# Patient Record
Sex: Female | Born: 1935 | Race: White | Hispanic: No | Smoking: Never smoker
Health system: Southern US, Community
[De-identification: ages and names within clinical notes are randomized; demographics above are authoritative.]

## PROBLEM LIST (undated history)

## (undated) DIAGNOSIS — F0150 Vascular dementia without behavioral disturbance: Secondary | ICD-10-CM

## (undated) DIAGNOSIS — I10 Essential (primary) hypertension: Secondary | ICD-10-CM

## (undated) DIAGNOSIS — M1990 Unspecified osteoarthritis, unspecified site: Secondary | ICD-10-CM

## (undated) DIAGNOSIS — E7800 Pure hypercholesterolemia, unspecified: Secondary | ICD-10-CM

## (undated) DIAGNOSIS — R319 Hematuria, unspecified: Secondary | ICD-10-CM

## (undated) DIAGNOSIS — N289 Disorder of kidney and ureter, unspecified: Secondary | ICD-10-CM

## (undated) DIAGNOSIS — B029 Zoster without complications: Secondary | ICD-10-CM

## (undated) DIAGNOSIS — B0229 Other postherpetic nervous system involvement: Secondary | ICD-10-CM

## (undated) DIAGNOSIS — G459 Transient cerebral ischemic attack, unspecified: Secondary | ICD-10-CM

## (undated) HISTORY — PX: APPENDECTOMY: SHX54

## (undated) HISTORY — PX: TUBAL LIGATION: SHX77

## (undated) HISTORY — PX: COLPORRHAPHY: SHX921

---

## 1999-02-02 ENCOUNTER — Encounter: Payer: Self-pay | Admitting: Family Medicine

## 2000-06-19 ENCOUNTER — Other Ambulatory Visit: Admission: RE | Admit: 2000-06-19 | Discharge: 2000-06-19 | Payer: Self-pay | Admitting: Family Medicine

## 2001-12-14 ENCOUNTER — Ambulatory Visit (HOSPITAL_COMMUNITY): Admission: RE | Admit: 2001-12-14 | Discharge: 2001-12-14 | Payer: Self-pay | Admitting: Specialist

## 2002-01-13 ENCOUNTER — Ambulatory Visit (HOSPITAL_COMMUNITY): Admission: RE | Admit: 2002-01-13 | Discharge: 2002-01-13 | Payer: Self-pay | Admitting: Specialist

## 2003-05-26 ENCOUNTER — Ambulatory Visit (HOSPITAL_COMMUNITY): Admission: RE | Admit: 2003-05-26 | Discharge: 2003-05-26 | Payer: Self-pay | Admitting: Family Medicine

## 2003-05-26 ENCOUNTER — Encounter: Payer: Self-pay | Admitting: Family Medicine

## 2007-03-19 ENCOUNTER — Ambulatory Visit: Payer: Self-pay | Admitting: Family Medicine

## 2008-01-11 ENCOUNTER — Emergency Department: Payer: Self-pay | Admitting: Emergency Medicine

## 2008-09-23 ENCOUNTER — Ambulatory Visit: Payer: Self-pay | Admitting: Urology

## 2011-06-05 ENCOUNTER — Ambulatory Visit: Payer: Self-pay | Admitting: Family Medicine

## 2012-11-06 ENCOUNTER — Ambulatory Visit: Payer: Self-pay | Admitting: Neurology

## 2013-11-25 ENCOUNTER — Ambulatory Visit: Payer: Self-pay | Admitting: Urology

## 2014-08-21 ENCOUNTER — Emergency Department (HOSPITAL_COMMUNITY): Payer: Medicare Other

## 2014-08-21 ENCOUNTER — Encounter (HOSPITAL_COMMUNITY): Payer: Self-pay | Admitting: Emergency Medicine

## 2014-08-21 ENCOUNTER — Inpatient Hospital Stay (HOSPITAL_COMMUNITY)
Admission: EM | Admit: 2014-08-21 | Discharge: 2014-08-24 | DRG: 065 | Disposition: A | Discharge status: Skilled Nursing Facility | Payer: Medicare Other | Attending: Infectious Diseases | Admitting: Infectious Diseases

## 2014-08-21 ENCOUNTER — Inpatient Hospital Stay (HOSPITAL_COMMUNITY): Payer: Medicare Other

## 2014-08-21 DIAGNOSIS — I10 Essential (primary) hypertension: Secondary | ICD-10-CM | POA: Diagnosis present

## 2014-08-21 DIAGNOSIS — I2699 Other pulmonary embolism without acute cor pulmonale: Secondary | ICD-10-CM

## 2014-08-21 DIAGNOSIS — I129 Hypertensive chronic kidney disease with stage 1 through stage 4 chronic kidney disease, or unspecified chronic kidney disease: Secondary | ICD-10-CM | POA: Diagnosis present

## 2014-08-21 DIAGNOSIS — Z8673 Personal history of transient ischemic attack (TIA), and cerebral infarction without residual deficits: Secondary | ICD-10-CM | POA: Diagnosis not present

## 2014-08-21 DIAGNOSIS — I6340 Cerebral infarction due to embolism of unspecified cerebral artery: Secondary | ICD-10-CM | POA: Diagnosis present

## 2014-08-21 DIAGNOSIS — R29810 Facial weakness: Secondary | ICD-10-CM

## 2014-08-21 DIAGNOSIS — Z7982 Long term (current) use of aspirin: Secondary | ICD-10-CM | POA: Diagnosis not present

## 2014-08-21 DIAGNOSIS — I639 Cerebral infarction, unspecified: Secondary | ICD-10-CM

## 2014-08-21 DIAGNOSIS — N189 Chronic kidney disease, unspecified: Secondary | ICD-10-CM | POA: Diagnosis present

## 2014-08-21 DIAGNOSIS — R4781 Slurred speech: Secondary | ICD-10-CM

## 2014-08-21 DIAGNOSIS — I480 Paroxysmal atrial fibrillation: Secondary | ICD-10-CM | POA: Diagnosis present

## 2014-08-21 DIAGNOSIS — I82409 Acute embolism and thrombosis of unspecified deep veins of unspecified lower extremity: Secondary | ICD-10-CM

## 2014-08-21 DIAGNOSIS — Z79899 Other long term (current) drug therapy: Secondary | ICD-10-CM | POA: Diagnosis not present

## 2014-08-21 DIAGNOSIS — B0229 Other postherpetic nervous system involvement: Secondary | ICD-10-CM | POA: Diagnosis present

## 2014-08-21 DIAGNOSIS — E785 Hyperlipidemia, unspecified: Secondary | ICD-10-CM | POA: Diagnosis present

## 2014-08-21 DIAGNOSIS — I63412 Cerebral infarction due to embolism of left middle cerebral artery: Secondary | ICD-10-CM

## 2014-08-21 HISTORY — DX: Unspecified osteoarthritis, unspecified site: M19.90

## 2014-08-21 HISTORY — DX: Transient cerebral ischemic attack, unspecified: G45.9

## 2014-08-21 HISTORY — DX: Disorder of kidney and ureter, unspecified: N28.9

## 2014-08-21 HISTORY — DX: Pure hypercholesterolemia, unspecified: E78.00

## 2014-08-21 HISTORY — DX: Hematuria, unspecified: R31.9

## 2014-08-21 HISTORY — DX: Zoster without complications: B02.9

## 2014-08-21 HISTORY — DX: Other postherpetic nervous system involvement: B02.29

## 2014-08-21 HISTORY — DX: Essential (primary) hypertension: I10

## 2014-08-21 LAB — URINALYSIS, ROUTINE W REFLEX MICROSCOPIC
Bilirubin Urine: NEGATIVE
Glucose, UA: NEGATIVE mg/dL
Ketones, ur: NEGATIVE mg/dL
Nitrite: NEGATIVE
Protein, ur: NEGATIVE mg/dL
Specific Gravity, Urine: 1.008 (ref 1.005–1.030)
Urobilinogen, UA: 0.2 mg/dL (ref 0.0–1.0)
pH: 6.5 (ref 5.0–8.0)

## 2014-08-21 LAB — DIFFERENTIAL
Basophils Absolute: 0 10*3/uL (ref 0.0–0.1)
Basophils Relative: 0 % (ref 0–1)
Eosinophils Absolute: 0.1 10*3/uL (ref 0.0–0.7)
Eosinophils Relative: 1 % (ref 0–5)
Lymphocytes Relative: 22 % (ref 12–46)
Lymphs Abs: 2.5 10*3/uL (ref 0.7–4.0)
Monocytes Absolute: 0.8 10*3/uL (ref 0.1–1.0)
Monocytes Relative: 7 % (ref 3–12)
Neutro Abs: 8 10*3/uL — ABNORMAL HIGH (ref 1.7–7.7)
Neutrophils Relative %: 70 % (ref 43–77)

## 2014-08-21 LAB — URINE MICROSCOPIC-ADD ON

## 2014-08-21 LAB — ETHANOL

## 2014-08-21 LAB — COMPREHENSIVE METABOLIC PANEL WITH GFR
ALT: 13 U/L (ref 0–35)
AST: 20 U/L (ref 0–37)
Albumin: 3.8 g/dL (ref 3.5–5.2)
Alkaline Phosphatase: 112 U/L (ref 39–117)
Anion gap: 14 (ref 5–15)
BUN: 41 mg/dL — ABNORMAL HIGH (ref 6–23)
CO2: 25 meq/L (ref 19–32)
Calcium: 9 mg/dL (ref 8.4–10.5)
Chloride: 99 meq/L (ref 96–112)
Creatinine, Ser: 2.4 mg/dL — ABNORMAL HIGH (ref 0.50–1.10)
GFR calc Af Amer: 20 mL/min — ABNORMAL LOW
GFR calc non Af Amer: 17 mL/min — ABNORMAL LOW
Glucose, Bld: 107 mg/dL — ABNORMAL HIGH (ref 70–99)
Potassium: 4.8 meq/L (ref 3.7–5.3)
Sodium: 138 meq/L (ref 137–147)
Total Bilirubin: 0.4 mg/dL (ref 0.3–1.2)
Total Protein: 6.8 g/dL (ref 6.0–8.3)

## 2014-08-21 LAB — I-STAT CHEM 8, ED
BUN: 37 mg/dL — ABNORMAL HIGH (ref 6–23)
Calcium, Ion: 1.08 mmol/L — ABNORMAL LOW (ref 1.13–1.30)
Chloride: 104 meq/L (ref 96–112)
Creatinine, Ser: 2.5 mg/dL — ABNORMAL HIGH (ref 0.50–1.10)
Glucose, Bld: 107 mg/dL — ABNORMAL HIGH (ref 70–99)
HCT: 42 % (ref 36.0–46.0)
Hemoglobin: 14.3 g/dL (ref 12.0–15.0)
Potassium: 4.6 meq/L (ref 3.7–5.3)
Sodium: 137 meq/L (ref 137–147)
TCO2: 23 mmol/L (ref 0–100)

## 2014-08-21 LAB — I-STAT TROPONIN, ED: TROPONIN I, POC: 0.03 ng/mL (ref 0.00–0.08)

## 2014-08-21 LAB — PROTIME-INR
INR: 1.08 (ref 0.00–1.49)
Prothrombin Time: 14.1 s (ref 11.6–15.2)

## 2014-08-21 LAB — APTT: aPTT: 27 seconds (ref 24–37)

## 2014-08-21 LAB — RAPID URINE DRUG SCREEN, HOSP PERFORMED
Amphetamines: NOT DETECTED
Barbiturates: NOT DETECTED
Benzodiazepines: NOT DETECTED
Cocaine: NOT DETECTED
Opiates: NOT DETECTED
Tetrahydrocannabinol: NOT DETECTED

## 2014-08-21 LAB — CBC
HCT: 39.6 % (ref 36.0–46.0)
Hemoglobin: 13.3 g/dL (ref 12.0–15.0)
MCH: 30.2 pg (ref 26.0–34.0)
MCHC: 33.6 g/dL (ref 30.0–36.0)
MCV: 90 fL (ref 78.0–100.0)
Platelets: 342 10*3/uL (ref 150–400)
RBC: 4.4 MIL/uL (ref 3.87–5.11)
RDW: 13.8 % (ref 11.5–15.5)
WBC: 11.3 10*3/uL — ABNORMAL HIGH (ref 4.0–10.5)

## 2014-08-21 MED ORDER — SENNOSIDES-DOCUSATE SODIUM 8.6-50 MG PO TABS: 1.0000 | ORAL_TABLET | Freq: Every evening | ORAL | Status: DC | PRN

## 2014-08-21 MED ORDER — GABAPENTIN 400 MG PO CAPS
400.0000 mg | ORAL_CAPSULE | ORAL | Status: DC
  Administered 2014-08-21 – 2014-08-24 (×8): 400 mg via ORAL
  Filled 2014-08-21 (×8): qty 1

## 2014-08-21 MED ORDER — LIDOCAINE 5 % EX PTCH
1.0000 | MEDICATED_PATCH | CUTANEOUS | Status: DC
  Administered 2014-08-21: 1 via TRANSDERMAL
  Filled 2014-08-21: qty 1

## 2014-08-21 MED ORDER — SIMVASTATIN 20 MG PO TABS: 20.0000 mg | ORAL_TABLET | Freq: Every day | ORAL | Status: DC

## 2014-08-21 MED ORDER — SODIUM CHLORIDE 0.9 % IV SOLN
INTRAVENOUS | Status: AC
  Administered 2014-08-21 – 2014-08-22 (×2): via INTRAVENOUS

## 2014-08-21 MED ORDER — ASPIRIN EC 81 MG PO TBEC: 81.0000 mg | DELAYED_RELEASE_TABLET | Freq: Every day | ORAL | Status: DC

## 2014-08-21 MED ORDER — ASPIRIN 325 MG PO TABS
325.0000 mg | ORAL_TABLET | Freq: Every day | ORAL | Status: DC
  Administered 2014-08-21 – 2014-08-22 (×2): 325 mg via ORAL
  Filled 2014-08-21 (×2): qty 1

## 2014-08-21 MED ORDER — SIMVASTATIN 20 MG PO TABS
20.0000 mg | ORAL_TABLET | Freq: Every day | ORAL | Status: DC
  Administered 2014-08-21 – 2014-08-23 (×3): 20 mg via ORAL
  Filled 2014-08-21 (×3): qty 1

## 2014-08-21 MED ORDER — STROKE: EARLY STAGES OF RECOVERY BOOK
Freq: Once | Status: AC
  Administered 2014-08-21: 1
  Filled 2014-08-21: qty 1

## 2014-08-21 MED ORDER — HEPARIN SODIUM (PORCINE) 5000 UNIT/ML IJ SOLN
5000.0000 [IU] | Freq: Three times a day (TID) | INTRAMUSCULAR | Status: DC
  Administered 2014-08-21 – 2014-08-23 (×5): 5000 [IU] via SUBCUTANEOUS
  Filled 2014-08-21 (×5): qty 1

## 2014-08-21 MED ORDER — TRAMADOL HCL 50 MG PO TABS
50.0000 mg | ORAL_TABLET | Freq: Three times a day (TID) | ORAL | Status: DC
  Administered 2014-08-21 – 2014-08-24 (×8): 50 mg via ORAL
  Filled 2014-08-21 (×8): qty 1

## 2014-08-21 NOTE — Progress Notes (Signed)
Received from ER via stretcher; oriented to room and unit routine; daughter at bedside; patient alert and cooperative; assisted off the stretcher; was incontinent of urine; assisted to bedside commode then assisted into bed; vital signs taken. No acute distress.

## 2014-08-21 NOTE — H&P (Signed)
Date: 08/21/2014               Patient Name:  Kristina Aguirre MRN: [id number]  DOB: [birth year] Age / Sex: 80 y.o., female   PCP: Jerl Mina, MD         Medical Service: Internal Medicine Teaching Service         Attending Physician: Dr. Ginnie Smart, MD    First Contact: Dr. Senaida Ores Pager: [phone]  Second Contact: Dr. Yetta Barre Pager: [phone]       After Hours (After 5p/  First Contact Pager: [phone]  weekends / holidays): Second Contact Pager: [phone]   Chief Complaint: facial droop, numbness  History of Present Illness: Kristina Aguirre is a 80 yo female with PMHx of HTN, HLD, TIA, CKD, shingles and post-herpetic neuralgia who presented to the ED with complaint of facial droop and numbness. Patient awoke this morning around 7 am and went to eat her breakfast as normal around 0730. She noticed that she didn't feel right but couldn't describe her symptoms. Patient did admit to some dysarthria when she called her daughter, but it has since improved. She also had numbness of her right hand in all 5 digits which has improved to only involving her lateral 3 fingers. Patient admits to weakness, but this is chronic for her and not worsened from baseline. Patient denies any headache, dizziness, chest pain, shortness of breath, or nausea. She has chronic pain on the right side of her face and neck as a result from shingles two years ago. She lives by herself at an assisted living facility and generally has few issues. She walks each day and can complete her ADLs independently. She is unsure of her family history as she is adopted. Her son passed away from an MI. She tried smoking tobacco in college but was not a smoker. Patient has been taking her medications as prescribed.  CT Head showed no acute intracranial abnormality, cerebral and cerebellar atrophy, mild chronic microvascular ischemic white matter disease, and intracranial atherosclerosis.  MRI Head showed acute/subacute focal infarcts  involving the left frontal operculum and subcortical white matter and moderate generalized atrophy and diffuse white matter disease likely reflecting the sequelae of chronic microvascular ischemia.  Meds: Prescriptions prior to admission  Medication Sig Dispense Refill Last Dose  . amLODipine (NORVASC) 2.5 MG tablet Take 2.5 mg by mouth daily.   08/21/2014 at Unknown time  . aspirin EC 81 MG tablet Take 81 mg by mouth at bedtime.   08/20/2014 at Unknown time  . gabapentin (NEURONTIN) 400 MG capsule Take 400 mg by mouth 3 (three) times daily. Morning, 3pm and bedtime   08/21/2014 at Unknown time  . lidocaine (LIDODERM) 5 % Place 1 patch onto the skin daily. Remove & Discard patch within 12 hours or as directed by MD   08/20/2014 at Unknown time  . loratadine (CLARITIN) 10 MG tablet Take 10 mg by mouth daily.   08/21/2014 at Unknown time  . OVER THE COUNTER MEDICATION Place 1 drop into both eyes at bedtime. Over the counter eye drops for dry eyes   08/20/2014 at Unknown time  . simvastatin (ZOCOR) 20 MG tablet Take 20 mg by mouth at bedtime.    08/20/2014 at Unknown time  . traMADol (ULTRAM) 50 MG tablet Take 50-100 mg by mouth 3 (three) times daily. Take 1 tablet (50 mg) every morning and at bedtime, take 2 tablets (100 mg) daily at 3pm   08/21/2014 at Unknown time  Current Facility-Administered Medications  Medication Dose Route Frequency Provider Last Rate Last Dose  .  stroke: mapping our early stages of recovery book   Does not apply Once Courtney Paris, MD      . 0.9 %  sodium chloride infusion   Intravenous Continuous Courtney Paris, MD      . Melene Muller ON 08/22/2014] aspirin EC tablet 81 mg  81 mg Oral QHS Courtney Paris, MD      . aspirin tablet 325 mg  325 mg Oral Daily Courtney Paris, MD   325 mg at 08/21/14 1654  . gabapentin (NEURONTIN) capsule 400 mg  400 mg Oral 3 times per day Courtney Paris, MD      . heparin injection 5,000 Units  5,000 Units Subcutaneous 3 times per day Courtney Paris, MD      .  lidocaine (LIDODERM) 5 % 1 patch  1 patch Transdermal Q24H Courtney Paris, MD      . senna-docusate (Senokot-S) tablet 1 tablet  1 tablet Oral QHS PRN Courtney Paris, MD      . simvastatin (ZOCOR) tablet 20 mg  20 mg Oral QHS Courtney Paris, MD      . traMADol Janean Sark) tablet 50 mg  50 mg Oral TID Courtney Paris, MD        Allergies: Allergies as of 08/21/2014 - Review Complete 08/21/2014  Allergen Reaction Noted  . Penicillins Hives 08/21/2014   Past Medical History  Diagnosis Date  . Hypertension   . High cholesterol   . TIA (transient ischemic attack)   . Renal disorder   . Hematuria   . Arthritis   . Post herpetic neuralgia     Right side of neck  . Shingles    Past Surgical History  Procedure Laterality Date  . Tubal ligation     History reviewed. No pertinent family history. History   Social History  . Marital Status: Widowed    Spouse Name: N/A    Number of Children: N/A  . Years of Education: N/A   Occupational History  . Not on file.   Social History Main Topics  . Smoking status: Never Smoker   . Smokeless tobacco: Never Used  . Alcohol Use: 0.6 oz/week    1 Glasses of wine per week     Comment: "I like a glass of wine occasionally"  . Drug Use: No  . Sexual Activity: Not on file   Other Topics Concern  . Not on file   Social History Narrative  . No narrative on file    Review of Systems: General: Denies fever, chills, fatigue, change in appetite and diaphoresis.  Respiratory: Denies SOB, cough, DOE, chest tightness, and wheezing.   Cardiovascular: Denies chest pain and palpitations.  Gastrointestinal: Denies nausea, vomiting, abdominal pain, diarrhea, constipation, blood in stool and abdominal distention.  Genitourinary: Denies dysuria, urgency, frequency, hematuria, suprapubic pain and flank pain. Endocrine: Denies hot or cold intolerance, polyuria, and polydipsia. Musculoskeletal: Denies myalgias, back pain, joint swelling, arthralgias and gait  problem.  Skin: Denies pallor, rash and wounds.  Neurological: Admits to numbness in right hand, dysarthria and right facial droop. Denies dizziness, headaches, weakness, lightheadedness, seizures, and syncope. Psychiatric/Behavioral: Denies mood changes, confusion, nervousness, sleep disturbance and agitation.  Physical Exam: Filed Vitals:   08/21/14 1530 08/21/14 1535 08/21/14 1545 08/21/14 1629  BP: 151/65  161/62 145/64  Pulse: 71  73 66  Temp:  98.8 F (37.1 C)  98.1 F (36.7 C)  TempSrc:    Oral  Resp: 20  22 20   SpO2: 97%  97% 97%   General: Vital signs reviewed.  Patient is well-developed and well-nourished, in no acute distress and cooperative with exam.  Eyes: PERRLA, EOMI, conjunctivae normal, no scleral icterus.  Neck: Chronic flexion. No carotid bruit present.  Cardiovascular: RRR. 3/6 systolic ejection murmur, S1 normal, S2 normal, no gallops, or rubs. Pulmonary/Chest: Clear to auscultation bilaterally, no wheezes, rales, or rhonchi. Abdominal: Soft, non-tender, non-distended, BS +, no masses, organomegaly, or guarding present.  Musculoskeletal: No joint deformities, erythema, or stiffness, ROM full and nontender. Extremities: No lower extremity edema bilaterally,  pulses symmetric and intact bilaterally. No cyanosis or clubbing. Skin: Warm, dry and intact. No rashes or erythema. Psychiatric: Normal mood and affect. speech and behavior is normal. Cognition and memory are normal.   Neurologic Exam:   Mental Status: Alert, oriented x 3, thought content appropriate. Speech fluent without evidence of aphasia. Able to follow 3 step commands without difficulty.  Cranial Nerves:   II: Discs flat bilaterally. Visual fields grossly intact.  III/IV/VI: Extraocular movements intact.  Pupils reactive bilaterally.  V/VII: Smile asymmetric, facial droop on right. facial light touch sensation normal bilaterally.  VIII: Grossly intact.  XI: Bilateral shoulder shrug normal.  XII:  Midline tongue extension normal.  Motor:  4/5 bilaterally with normal tone and bulk  Sensory:  Pinprick and light touch decreased in medial 3 fingers (3, 4, 5) in right hand.  Cerebellar: Normal finger-to-nose, slow rapid alternating movements and normal heel-to-shin test.     Lab results: Basic Metabolic Panel:  Recent Labs  16/07/9610/08/15 1305 08/21/14 1313  NA 138 137  K 4.8 4.6  CL 99 104  CO2 25  --   GLUCOSE 107* 107*  BUN 41* 37*  CREATININE 2.40* 2.50*  CALCIUM 9.0  --    Liver Function Tests:  Recent Labs  08/21/14 1305  AST 20  ALT 13  ALKPHOS 112  BILITOT 0.4  PROT 6.8  ALBUMIN 3.8   CBC:  Recent Labs  08/21/14 1305 08/21/14 1313  WBC 11.3*  --   NEUTROABS 8.0*  --   HGB 13.3 14.3  HCT 39.6 42.0  MCV 90.0  --   PLT 342  --    Coagulation:  Recent Labs  08/21/14 1305  LABPROT 14.1  INR 1.08   Urine Drug Screen: Drugs of Abuse     Component Value Date/Time   LABOPIA NONE DETECTED 08/21/2014 1354   COCAINSCRNUR NONE DETECTED 08/21/2014 1354   LABBENZ NONE DETECTED 08/21/2014 1354   AMPHETMU NONE DETECTED 08/21/2014 1354   THCU NONE DETECTED 08/21/2014 1354   LABBARB NONE DETECTED 08/21/2014 1354    Alcohol Level:  Recent Labs  08/21/14 1305  ETH <11   Urinalysis:  Recent Labs  08/21/14 1354  COLORURINE YELLOW  LABSPEC 1.008  PHURINE 6.5  GLUCOSEU NEGATIVE  HGBUR MODERATE*  BILIRUBINUR NEGATIVE  KETONESUR NEGATIVE  PROTEINUR NEGATIVE  UROBILINOGEN 0.2  NITRITE NEGATIVE  LEUKOCYTESUR TRACE*   Imaging results:  Ct Head Wo Contrast  08/21/2014   CLINICAL DATA:  80 year old female with right-sided facial droop and slurred speech  EXAM: CT HEAD WITHOUT CONTRAST  TECHNIQUE: Contiguous axial images were obtained from the base of the skull through the vertex without intravenous contrast.  COMPARISON:  None.  FINDINGS: Negative for acute intracranial hemorrhage, acute infarction, mass, mass effect, hydrocephalus or midline shift.  Gray-white differentiation is preserved throughout. Visualized  globes are symmetric and unremarkable bilaterally. No focal soft tissue or calvarial abnormality. Mild global cerebral and cerebellar atrophy. Very mild periventricular white matter hypoattenuation consistent was sequelae of longstanding microvascular ischemic white matter disease. Atherosclerotic calcifications present in both cavernous carotid arteries. Normal aeration of the mastoid air cells and visualized paranasal sinuses.  IMPRESSION: 1. No acute intracranial abnormality 2. Cerebral and cerebellar atrophy 3. Mild chronic microvascular ischemic white matter disease. 4. Intracranial atherosclerosis   Electronically Signed   By: Malachy Moan M.D.   On: 08/21/2014 13:24   Mr Brain Wo Contrast  08/21/2014   CLINICAL DATA:  Patient awoke with focal right hand numbness. She is head slurred speech during the day. Right-sided facial droop.  EXAM: MRI HEAD WITHOUT CONTRAST  TECHNIQUE: Multiplanar, multiecho pulse sequences of the brain and surrounding structures were obtained without intravenous contrast.  COMPARISON:  CT head without contrast from the same day.  FINDINGS: A punctate area of cortical restricted diffusion is present in the left frontal operculum. There is cortical and subjacent white matter restricted diffusion more medially in the left frontal operculum as well, evident on image 23 of series 5 and image 18 of series 8. Minimal T2 changes are associated with these areas.  Moderate generalized atrophy and diffuse white matter changes are present bilaterally. Flow is present in the major intracranial arteries. Patient is status post bilateral lens replacements. The paranasal sinuses and mastoid air cells are clear.  IMPRESSION: 1. Acute/subacute focal infarcts involving the left frontal operculum and subcortical white matter as described. 2. Moderate generalized atrophy and diffuse white matter disease likely reflects the sequelae of  chronic microvascular ischemia.   Electronically Signed   By: Gennette Pac M.D.   On: 08/21/2014 15:23   Assessment & Plan by Problem: Active Problems:   CVA (cerebral vascular accident)   Essential hypertension   Acute embolic stroke  Acute Left MCA CVA: Patient presented with complaint of right facial droop and right hand numbness that was presented when she awoke this morning at 0700. Last known well was yesterday. Patient is out of the tPA window. MRI Head showed acute/subacute focal infarcts involving the left frontal operculum and subcortical white matter and moderate generalized atrophy and diffuse white matter disease likely reflecting the sequelae of chronic microvascular ischemia. NIH score 3. Patient was on ASA 81 mg daily. Patient was seen by neurology who recommended stroke work up and risk factor modification. -Hemoglobin A1c -Fasting lipid panel -MRA Brain w/o contrast -Echo -Carotid Dopplers  -Aspirin 325 mg daily -Telemetry monitoring -Aspiration precautions -Bed rest -neuro checks Q2H -PT/OT -SCDs  HTN: Blood pressure ranging from 140s/60s to 160s/60s. Patient is normally on amlodipine 2.5 mg daily at home. We will allow for permissive hypertension.  -Allow for permissive hypertension <220   HLD: Patient has history of hyperlipidemia, but there are no records of a lipid panel in our system. Patient is on simvastatin 20 mg daily at home.  -Lipid panel -Simvastatin 20 mg daily   Post-herpetic Neuralgia: Patient has post-herpetic neuralgia on the right side of her face, right ear and right lateral neck as a result of a shingles outbreak 2 years ago. -Tramadol 50 mg QAM and QHS -Tramadol 100 mg at lunchtime  DVT/PE ppx: Heparin SQ TID  Dispo: Disposition is deferred at this time, awaiting improvement of current medical problems. Anticipated discharge in approximately 1-2 day(s).   The patient does have a current PCP Jerl Mina, MD) and does not need an [facility]  hospital follow-up appointment after discharge.  The patient does not have transportation limitations that hinder transportation to clinic appointments.  Signed: Jill AlexandersAlexa Richardson, DO PGY-1 Internal Medicine Resident Pager # [phone] 08/21/2014 5:14 PM

## 2014-08-21 NOTE — Plan of Care (Signed)
Problem: Consults Goal: Ischemic Stroke Patient Education See Patient Education Module for education specifics.  Outcome: Completed/Met Date Met:  08/21/14

## 2014-08-21 NOTE — ED Provider Notes (Addendum)
CSN: [id number]     Arrival date & time 08/21/14  1303 History   First MD Initiated Contact with Patient 08/21/14 1305     Chief Complaint  Patient presents with  . Cerebrovascular Accident     (Consider location/radiation/quality/duration/timing/severity/associated sxs/prior Treatment) The history is provided by the patient and the EMS personnel. The history is limited by the condition of the patient.  pt arrives from her independent living situation by ems.  Pt indicates last seemed/felt normal yesterday. She awoke around 7 am today, and states she just didn't feel right, ?right hand numbness.  Pt is very poor/difficult historian - level 5 caveat.   She states she was able to eat breakfast, and sometime later was talking to daughter on phone and said she expressed that she wasn't feeling right, and daughter ?noted that her speech seemed different from baseline, and ems was callled.  Ems noted ?right facial droop, and states pt is moving bil extremities and has equal grips.  Unclear from pt/report when patient last seen normal ?sometime yesterday prior to going to bed.      No past medical history on file. No past surgical history on file. No family history on file. History  Substance Use Topics  . Smoking status: Not on file  . Smokeless tobacco: Not on file  . Alcohol Use: Not on file   OB History    No data available     Review of Systems  Constitutional: Negative for fever and chills.  HENT: Negative for sore throat.   Eyes: Negative for visual disturbance.  Respiratory: Negative for cough and shortness of breath.   Cardiovascular: Negative for chest pain.  Gastrointestinal: Negative for vomiting, abdominal pain and diarrhea.  Genitourinary: Negative for flank pain.  Musculoskeletal: Negative for back pain and neck pain.  Skin: Negative for rash.  Neurological: Positive for numbness. Negative for headaches.  Hematological: Does not bruise/bleed easily.   Psychiatric/Behavioral: Negative for agitation.      Allergies  Review of patient's allergies indicates not on file.  Home Medications   Prior to Admission medications   Not on File   There were no vitals taken for this visit. Physical Exam  Constitutional: She appears well-developed and well-nourished. No distress.  HENT:  Head: Atraumatic.  Mouth/Throat: Oropharynx is clear and moist.  Eyes: Conjunctivae and EOM are normal. Pupils are equal, round, and reactive to light. No scleral icterus.  Neck: Neck supple. No tracheal deviation present.  No bruit  Cardiovascular: Normal rate, regular rhythm, normal heart sounds and intact distal pulses.   Pulmonary/Chest: Effort normal and breath sounds normal. No respiratory distress.  Abdominal: Soft. Normal appearance and bowel sounds are normal. She exhibits no distension. There is no tenderness.  Genitourinary:  No cva tenderness  Musculoskeletal: She exhibits no edema or tenderness.  Neurological: She is alert.  Awake and alert, speech very quiet, and mildly dysarthric. +right facial droop. No pronator drift. Equal grip. Moves bil ext with good strength.    Skin: Skin is warm and dry. No rash noted. She is not diaphoretic.  Psychiatric: She has a normal mood and affect.  Nursing note and vitals reviewed.   ED Course  Procedures (including critical care time) Labs Review   Results for orders placed or performed during the hospital encounter of 08/21/14  Ethanol  Result Value Ref Range   Alcohol, Ethyl (B) <11 0 - 11 mg/dL  Protime-INR  Result Value Ref Range   Prothrombin Time 14.1 11.6 -  15.2 seconds   INR 1.08 0.00 - 1.49  APTT  Result Value Ref Range   aPTT 27 24 - 37 seconds  CBC  Result Value Ref Range   WBC 11.3 (H) 4.0 - 10.5 K/uL   RBC 4.40 3.87 - 5.11 MIL/uL   Hemoglobin 13.3 12.0 - 15.0 g/dL   HCT 16.139.6 09.636.0 - 04.546.0 %   MCV 90.0 78.0 - 100.0 fL   MCH 30.2 26.0 - 34.0 pg   MCHC 33.6 30.0 - 36.0 g/dL    RDW 40.913.8 81.111.5 - 91.415.5 %   Platelets 342 150 - 400 K/uL  Differential  Result Value Ref Range   Neutrophils Relative % 70 43 - 77 %   Neutro Abs 8.0 (H) 1.7 - 7.7 K/uL   Lymphocytes Relative 22 12 - 46 %   Lymphs Abs 2.5 0.7 - 4.0 K/uL   Monocytes Relative 7 3 - 12 %   Monocytes Absolute 0.8 0.1 - 1.0 K/uL   Eosinophils Relative 1 0 - 5 %   Eosinophils Absolute 0.1 0.0 - 0.7 K/uL   Basophils Relative 0 0 - 1 %   Basophils Absolute 0.0 0.0 - 0.1 K/uL  Comprehensive metabolic panel  Result Value Ref Range   Sodium 138 137 - 147 mEq/L   Potassium 4.8 3.7 - 5.3 mEq/L   Chloride 99 96 - 112 mEq/L   CO2 25 19 - 32 mEq/L   Glucose, Bld 107 (H) 70 - 99 mg/dL   BUN 41 (H) 6 - 23 mg/dL   Creatinine, Ser 7.822.40 (H) 0.50 - 1.10 mg/dL   Calcium 9.0 8.4 - 95.610.5 mg/dL   Total Protein 6.8 6.0 - 8.3 g/dL   Albumin 3.8 3.5 - 5.2 g/dL   AST 20 0 - 37 U/L   ALT 13 0 - 35 U/L   Alkaline Phosphatase 112 39 - 117 U/L   Total Bilirubin 0.4 0.3 - 1.2 mg/dL   GFR calc non Af Amer 17 (L) >90 mL/min   GFR calc Af Amer 20 (L) >90 mL/min   Anion gap 14 5 - 15  Urine Drug Screen  Result Value Ref Range   Opiates NONE DETECTED NONE DETECTED   Cocaine NONE DETECTED NONE DETECTED   Benzodiazepines NONE DETECTED NONE DETECTED   Amphetamines NONE DETECTED NONE DETECTED   Tetrahydrocannabinol NONE DETECTED NONE DETECTED   Barbiturates NONE DETECTED NONE DETECTED  Urinalysis, Routine w reflex microscopic  Result Value Ref Range   Color, Urine YELLOW YELLOW   APPearance CLEAR CLEAR   Specific Gravity, Urine 1.008 1.005 - 1.030   pH 6.5 5.0 - 8.0   Glucose, UA NEGATIVE NEGATIVE mg/dL   Hgb urine dipstick MODERATE (A) NEGATIVE   Bilirubin Urine NEGATIVE NEGATIVE   Ketones, ur NEGATIVE NEGATIVE mg/dL   Protein, ur NEGATIVE NEGATIVE mg/dL   Urobilinogen, UA 0.2 0.0 - 1.0 mg/dL   Nitrite NEGATIVE NEGATIVE   Leukocytes, UA TRACE (A) NEGATIVE  Urine microscopic-add on  Result Value Ref Range   Squamous  Epithelial / LPF RARE RARE   WBC, UA 3-6 <3 WBC/hpf   RBC / HPF 0-2 <3 RBC/hpf  I-Stat Chem 8, ED  Result Value Ref Range   Sodium 137 137 - 147 mEq/L   Potassium 4.6 3.7 - 5.3 mEq/L   Chloride 104 96 - 112 mEq/L   BUN 37 (H) 6 - 23 mg/dL   Creatinine, Ser 2.132.50 (H) 0.50 - 1.10 mg/dL   Glucose, Bld 086107 (H) 70 -  99 mg/dL   Calcium, Ion 1.61 (L) 1.13 - 1.30 mmol/L   TCO2 23 0 - 100 mmol/L   Hemoglobin 14.3 12.0 - 15.0 g/dL   HCT 09.6 04.5 - 40.9 %  I-Stat Troponin, ED (not at [facility])  Result Value Ref Range   Troponin i, poc 0.03 0.00 - 0.08 ng/mL   Comment 3           Ct Head Wo Contrast  08/21/2014   CLINICAL DATA:  80 year old female with right-sided facial droop and slurred speech  EXAM: CT HEAD WITHOUT CONTRAST  TECHNIQUE: Contiguous axial images were obtained from the base of the skull through the vertex without intravenous contrast.  COMPARISON:  None.  FINDINGS: Negative for acute intracranial hemorrhage, acute infarction, mass, mass effect, hydrocephalus or midline shift. Gray-white differentiation is preserved throughout. Visualized globes are symmetric and unremarkable bilaterally. No focal soft tissue or calvarial abnormality. Mild global cerebral and cerebellar atrophy. Very mild periventricular white matter hypoattenuation consistent was sequelae of longstanding microvascular ischemic white matter disease. Atherosclerotic calcifications present in both cavernous carotid arteries. Normal aeration of the mastoid air cells and visualized paranasal sinuses.  IMPRESSION: 1. No acute intracranial abnormality 2. Cerebral and cerebellar atrophy 3. Mild chronic microvascular ischemic white matter disease. 4. Intracranial atherosclerosis   Electronically Signed   By: Malachy Moan M.D.   On: 08/21/2014 13:24       EKG Interpretation   Date/Time:  Sunday August 21 2014 13:32:02 EST Ventricular Rate:  71 PR Interval:  64 QRS Duration: 133 QT Interval:  469 QTC Calculation: 510 R  Axis:   34 Text Interpretation:  Sinus rhythm Short PR interval Left bundle branch  block No previous tracing Confirmed by Denton Lank  MD, Caryn Bee ([id number]) on  08/21/2014 1:50:42 PM      MDM   Iv ns. Labs. Ct.  Reviewed nursing notes and prior charts for additional history.   CT neg acute.  Will admit and get mri.  Recheck pt, exam unchanged from prior.  Unassigned med service called for admission.  Discussed w teaching service resident, who indicates temp orders to them/Dr Ninetta Lights, will consult neurology.     Suzi Roots, MD 08/21/14 [phone]

## 2014-08-21 NOTE — Plan of Care (Signed)
Problem: Acute Treatment Outcomes Goal: Airway maintained/protected Outcome: Completed/Met Date Met:  08/21/14     

## 2014-08-21 NOTE — Plan of Care (Signed)
Problem: Acute Treatment Outcomes Goal: Neuro exam at baseline or improved Outcome: Completed/Met Date Met:  08/21/14

## 2014-08-21 NOTE — Consult Note (Signed)
Referring Physician: HATCHER, J    Chief Complaint: new-onset right facial droop and numbness of right hand.  HPI: Kristina Aguirre is an 80 y.o. female history of hypertension, hypercholesterolemia, TIA and arthritis, brought to the emergency room for evaluation of new onset right facial droop and right hand numbness. Patient was last known well yesterday. She woke up with current deficits at about 7 AM this morning. She's been taking aspirin 81 mg per day. CT scan of the head showed no acute normality. MRI showed acute focal infarcts involving left frontal operculum none cortical white matter. NIH stroke score was 3.  LSN: 08/20/2014, unclear time tPA Given: No: unknown last seen well; beyond time window for treatment consideration mRankin:  Past Medical History  Diagnosis Date  . Hypertension   . High cholesterol   . TIA (transient ischemic attack)   . Renal disorder   . Hematuria   . Arthritis   . Post herpetic neuralgia     Right side of neck  . Shingles     History reviewed. No pertinent family history.   Medications: I have reviewed the patient's current medications.  ROS: History obtained from patient's son and daughter-in-law and the patient  General ROS: negative for - chills, fatigue, fever, night sweats, weight gain or weight loss Psychological ROS: negative for - behavioral disorder, hallucinations, memory difficulties, mood swings or suicidal ideation Ophthalmic ROS: negative for - blurry vision, double vision, eye pain or loss of vision ENT ROS: negative for - epistaxis, nasal discharge, oral lesions, sore throat, tinnitus or vertigo Allergy and Immunology ROS: negative for - hives or itchy/watery eyes Hematological and Lymphatic ROS: negative for - bleeding problems, bruising or swollen lymph nodes Endocrine ROS: negative for - galactorrhea, hair pattern changes, polydipsia/polyuria or temperature intolerance Respiratory ROS: negative for - cough, hemoptysis,  shortness of breath or wheezing Cardiovascular ROS: negative for - chest pain, dyspnea on exertion, edema or irregular heartbeat Gastrointestinal ROS: negative for - abdominal pain, diarrhea, hematemesis, nausea/vomiting or stool incontinence Genito-Urinary ROS: negative for - dysuria, hematuria, incontinence or urinary frequency/urgency Musculoskeletal ROS: negative for - joint swelling or muscular weakness Neurological ROS: as noted in HPI Dermatological ROS: negative for rash and skin lesion changes  Physical Examination: Blood pressure 161/62, pulse 73, temperature 98.8 F (37.1 C), temperature source Oral, resp. rate 22, SpO2 97 %.  Neurologic Examination: Mental Status: Alert, oriented, thought content appropriate.  Speech moderately slurred without evidence of aphasia. Able to follow commands without difficulty. Cranial Nerves: II-Visual fields were normal. III/IV/VI-Pupils were equal and reacted. Extraocular movements were full and conjugate.    V/VII-no facial numbness and no facial weakness. VIII-normal. X-mild to moderate dysarthria. Motor: 5/5 bilaterally with normal tone and bulk Sensory: Normal throughout. Deep Tendon Reflexes: 2+ and symmetric. Plantars: Flexor bilaterally Cerebellar: Normal finger-to-nose testing.  Ct Head Wo Contrast  08/21/2014   CLINICAL DATA:  80 year old female with right-sided facial droop and slurred speech  EXAM: CT HEAD WITHOUT CONTRAST  TECHNIQUE: Contiguous axial images were obtained from the base of the skull through the vertex without intravenous contrast.  COMPARISON:  None.  FINDINGS: Negative for acute intracranial hemorrhage, acute infarction, mass, mass effect, hydrocephalus or midline shift. Gray-white differentiation is preserved throughout. Visualized globes are symmetric and unremarkable bilaterally. No focal soft tissue or calvarial abnormality. Mild global cerebral and cerebellar atrophy. Very mild periventricular white matter  hypoattenuation consistent was sequelae of longstanding microvascular ischemic white matter disease. Atherosclerotic calcifications present in both cavernous carotid  arteries. Normal aeration of the mastoid air cells and visualized paranasal sinuses.  IMPRESSION: 1. No acute intracranial abnormality 2. Cerebral and cerebellar atrophy 3. Mild chronic microvascular ischemic white matter disease. 4. Intracranial atherosclerosis   Electronically Signed   By: Malachy MoanHeath  McCullough M.D.   On: 08/21/2014 13:24   Mr Brain Wo Contrast  08/21/2014   CLINICAL DATA:  Patient awoke with focal right hand numbness. She is head slurred speech during the day. Right-sided facial droop.  EXAM: MRI HEAD WITHOUT CONTRAST  TECHNIQUE: Multiplanar, multiecho pulse sequences of the brain and surrounding structures were obtained without intravenous contrast.  COMPARISON:  CT head without contrast from the same day.  FINDINGS: A punctate area of cortical restricted diffusion is present in the left frontal operculum. There is cortical and subjacent white matter restricted diffusion more medially in the left frontal operculum as well, evident on image 23 of series 5 and image 18 of series 8. Minimal T2 changes are associated with these areas.  Moderate generalized atrophy and diffuse white matter changes are present bilaterally. Flow is present in the major intracranial arteries. Patient is status post bilateral lens replacements. The paranasal sinuses and mastoid air cells are clear.  IMPRESSION: 1. Acute/subacute focal infarcts involving the left frontal operculum and subcortical white matter as described. 2. Moderate generalized atrophy and diffuse white matter disease likely reflects the sequelae of chronic microvascular ischemia.   Electronically Signed   By: Gennette Pachris  Mattern M.D.   On: 08/21/2014 15:23    Assessment: 80 y.o. female presented with acute left MCA territory ischemic cerebral infarctions.  Stroke Risk Factors -  hyperlipidemia and hypertension  Plan: 1. HgbA1c, fasting lipid panel 2. MRI, MRA  of the brain without contrast 3. PT consult, OT consult, Speech consult 4. Echocardiogram 5. Carotid dopplers 6. Prophylactic therapy-Antiplatelet med: Aspirin  7. Risk factor modification 8. Telemetry monitoring   C.R. Roseanne RenoStewart, MD [facility] [phone]  08/21/2014, 3:59 PM

## 2014-08-21 NOTE — Discharge Summary (Signed)
Name: Kristina Aguirre MRN: [id number] DOB: 09/08/1927 80 y.o. PCP: Jerl MinaJames Hedrick, MD  Date of Admission: 08/21/2014  1:03 PM Date of Discharge: 08/24/2014 Attending Physician: Ginnie SmartJeffrey C Hatcher, MD  Discharge Diagnosis:  Principal Problem:   CVA (cerebral vascular accident) Active Problems:   Essential hypertension   Postherpetic neuralgia   HLD (hyperlipidemia)   Paroxysmal atrial fibrillation  Discharge Medications:   Medication List    STOP taking these medications        aspirin EC 81 MG tablet      TAKE these medications        amLODipine 2.5 MG tablet  Commonly known as:  NORVASC  Take 2.5 mg by mouth daily.     apixaban 2.5 MG Tabs tablet  Commonly known as:  ELIQUIS  Take 1 tablet (2.5 mg total) by mouth 2 (two) times daily.     gabapentin 400 MG capsule  Commonly known as:  NEURONTIN  Take 400 mg by mouth 3 (three) times daily. Morning, 3pm and bedtime     lidocaine 5 %  Commonly known as:  LIDODERM  Place 1 patch onto the skin daily. Remove & Discard patch within 12 hours or as directed by MD     loratadine 10 MG tablet  Commonly known as:  CLARITIN  Take 10 mg by mouth daily.     OVER THE COUNTER MEDICATION  Place 1 drop into both eyes at bedtime. Over the counter eye drops for dry eyes     simvastatin 20 MG tablet  Commonly known as:  ZOCOR  Take 20 mg by mouth at bedtime.     traMADol 50 MG tablet  Commonly known as:  ULTRAM  Take 50-100 mg by mouth 3 (three) times daily. Take 1 tablet (50 mg) every morning and at bedtime, take 2 tablets (100 mg) daily at 3pm        Disposition and follow-up:   [facility]  At the hospital follow up visit please address:  1.  Acute CVA: Please address symptom improvement and medication compliance with Eliquis. Please assess for any signs of bleeding or blood loss.  2.  Labs / imaging needed at time of follow-up: CBC  3.   Pending labs/ test needing follow-up: None  Follow-up Appointments: Follow-up Information    Follow up with HEDRICK,JAMES, MD.   Specialty:  Family Medicine   Why:  to make follow up appointment   Contact information:   [address] [facility] [place] [address] [phone]       Follow up with Xu,Jindong, MD On 09/26/2014.   Specialty:  Neurology   Why:  8:00 am   Contact information:   [address] [place] [phone]       Discharge Instructions: Discharge Instructions    Ambulatory referral to Neurology    Complete by:  As directed   Pt [facility]. Thanks     Call MD for:  difficulty breathing, headache or visual disturbances    Complete by:  As directed      Call MD for:  persistant dizziness or light-headedness    Complete by:  As directed      Diet - low sodium heart healthy    Complete by:  As directed      Diet - low sodium heart healthy  Complete by:  As directed      Increase activity slowly    Complete by:  As directed      Increase activity slowly    Complete by:  As directed            Consultations:    Procedures Performed:  Ct Head Wo Contrast  08/21/2014   CLINICAL DATA:  80 year old female with right-sided facial droop and slurred speech  EXAM: CT HEAD WITHOUT CONTRAST  TECHNIQUE: Contiguous axial images were obtained from the base of the skull through the vertex without intravenous contrast.  COMPARISON:  None.  FINDINGS: Negative for acute intracranial hemorrhage, acute infarction, mass, mass effect, hydrocephalus or midline shift. Gray-white differentiation is preserved throughout. Visualized globes are symmetric and unremarkable bilaterally. No focal soft tissue or calvarial abnormality. Mild global cerebral and cerebellar atrophy. Very mild periventricular white matter hypoattenuation consistent was sequelae of longstanding microvascular ischemic white  matter disease. Atherosclerotic calcifications present in both cavernous carotid arteries. Normal aeration of the mastoid air cells and visualized paranasal sinuses.  IMPRESSION: 1. No acute intracranial abnormality 2. Cerebral and cerebellar atrophy 3. Mild chronic microvascular ischemic white matter disease. 4. Intracranial atherosclerosis   Electronically Signed   By: Malachy MoanHeath  McCullough M.D.   On: 08/21/2014 13:24   Mr Maxine GlennMra Head Wo Contrast  08/21/2014   CLINICAL DATA:  Acute embolic stroke.  EXAM: MRA HEAD WITHOUT CONTRAST  TECHNIQUE: Angiographic images of the Circle of Willis were obtained using MRA technique without intravenous contrast.  COMPARISON:  Brain MRI from earlier the same day  FINDINGS: Patient motion results in a significant decrease in diagnostic sensitivity; aneurysms or stenotic lesions could easily be obscured. 3D acquisition was attempted, but of no added benefit.  Strongly left dominant posterior circulation. The vertebrobasilar system is tortuous but not aneurysmal. The PICAs are grossly patent. Superior cerebellar arteries are visible bilaterally. Symmetric appearance of the posterior cerebral arteries which are patent at least the P3 segments.  Balanced anterior circulation. No evidence for major vessel occlusion (with sensitivity significantly decreased beyond the M1 segments) or high-grade central stenosis.  No evidence of aneurysm.  IMPRESSION: 1. Significantly limited study due to patient motion. 2. No evidence of major vessel occlusion or high-grade stenosis.   Electronically Signed   By: Tiburcio PeaJonathan  Watts M.D.   On: 08/21/2014 18:43   Mr Brain Wo Contrast  08/21/2014   CLINICAL DATA:  Patient awoke with focal right hand numbness. She is head slurred speech during the day. Right-sided facial droop.  EXAM: MRI HEAD WITHOUT CONTRAST  TECHNIQUE: Multiplanar, multiecho pulse sequences of the brain and surrounding structures were obtained without intravenous contrast.  COMPARISON:  CT  head without contrast from the same day.  FINDINGS: A punctate area of cortical restricted diffusion is present in the left frontal operculum. There is cortical and subjacent white matter restricted diffusion more medially in the left frontal operculum as well, evident on image 23 of series 5 and image 18 of series 8. Minimal T2 changes are associated with these areas.  Moderate generalized atrophy and diffuse white matter changes are present bilaterally. Flow is present in the major intracranial arteries. Patient is status post bilateral lens replacements. The paranasal sinuses and mastoid air cells are clear.  IMPRESSION: 1. Acute/subacute focal infarcts involving the left frontal operculum and subcortical white matter as described. 2. Moderate generalized atrophy and diffuse white matter disease likely reflects the sequelae of chronic microvascular ischemia.   Electronically Signed   By:  Gennette Pac M.D.   On: 08/21/2014 15:23    Admission HPI: Ms. Tegtmeyer is a 80 yo female with PMHx of HTN, HLD, TIA, CKD, shingles and post-herpetic neuralgia who presented to the ED with complaint of facial droop and numbness. Patient awoke this morning around 7 am and went to eat her breakfast as normal around 0730. She noticed that she didn't feel right but couldn't describe her symptoms. Patient did admit to some dysarthria when she called her daughter, but it has since improved. She also had numbness of her right hand in all 5 digits which has improved to only involving her lateral 3 fingers. Patient admits to weakness, but this is chronic for her and not worsened from baseline. Patient denies any headache, dizziness, chest pain, shortness of breath, or nausea. She has chronic pain on the right side of her face and neck as a result from shingles two years ago. She lives by herself at an assisted living facility and generally has few issues. She walks each day and can complete her ADLs independently. She is unsure of  her family history as she is adopted. Her son passed away from an MI. She tried smoking tobacco in college but was not a smoker. Patient has been taking her medications as prescribed.  CT Head showed no acute intracranial abnormality, cerebral and cerebellar atrophy, mild chronic microvascular ischemic white matter disease, and intracranial atherosclerosis.  MRI Head showed acute/subacute focal infarcts involving the left frontal operculum and subcortical white matter and moderate generalized atrophy and diffuse white matter disease likely reflecting the sequelae of chronic microvascular ischemia.  Hospital Course by problem list: Principal Problem:   CVA (cerebral vascular accident) Active Problems:   Essential hypertension   Postherpetic neuralgia   HLD (hyperlipidemia)   Paroxysmal atrial fibrillation   Acute Left MCA CVA: Patient presented with complaint of right facial droop and right hand numbness that was presented when she awoke in the morning at 0700. Her last known well was the day prior. Patient was out of the tPA window. MRI Head showed acute/subacute focal infarcts involving the left frontal operculum and subcortical white matter and moderate generalized atrophy and diffuse white matter disease likely reflecting the sequelae of chronic microvascular ischemia. NIH score was 3. Patient was previously on ASA 81 mg daily at home. Patient was seen by neurology who recommended stroke work up and risk factor modification. Labs and tests were unremarkable. Patient does not have diabetes and lipid panel was within normal limits. MRA brain showed no significant major vessel occlusions. Echo showed grade 1 diastolic dysfunction, but no embolic stroke. Carotid dopplers showed no significant stenosis. Patient was on neuro checks, bed rest and telemetry. On the first night of admission, telemetry picked up atrial fibrillation that was previously undiagnosed. This was confirmed with EKG. Patient  spontaneously converted to sinus rhythm. Patient did admit to some dizziness during the episode, but states this is not unusual for her. We discussed anticoagulation with Neurology and with the patient and her family and agreed to starting Eliquis 2.5 mg BID. This dose was confirmed by pharmacy. Patient was educated on the signs and symptoms of bleeding to be aware of. Patient will follow up with her PCP and Neurology.  HTN: Patient's blood pressure ranged from 140s/60s to 160s/60s on admission. She is normally on amlodipine 2.5 mg daily at home. We allowed for permissive hypertension for 48 hours and resumed her amlodipine on discharge.  HLD: Patient has history of hyperlipidemia, but  there were no records of a lipid panel in our system. She is on simvastatin 20 mg daily at home. We continued her home simvastatin 20 mg daily and obtained a lipid panel which was within normal limits. We discussed increasing her statin to a high intensity statin with the patient and her family, but it was decided to leave the dosage at 20 mg.   Post-herpetic Neuralgia: Patient has post-herpetic neuralgia on the right side of her face, right ear and right lateral neck as a result of a shingles outbreak 2 years ago. We continued her home regimen of Tramadol 50 mg QAM and QHS and Tramadol 100 mg at lunchtime, gabapentin 400 mg TID, and lidocaine patches.   Discharge Vitals:   BP 143/64 mmHg  Pulse 63  Temp(Src) 97.5 F (36.4 C) (Oral)  Resp 16  Ht 5\' 2"  (1.575 m)  Wt 55.339 kg (122 lb)  BMI 22.31 kg/m2  SpO2 94%  Signed: Jill Alexanders, DO PGY-1 Internal Medicine Resident Pager # [phone] 08/24/2014 8:48 AM

## 2014-08-21 NOTE — ED Notes (Signed)
Cancelled Code Stroke per Dr. Denton LankSteinl

## 2014-08-21 NOTE — ED Notes (Signed)
Patient transported to MRI 

## 2014-08-21 NOTE — ED Notes (Signed)
[facility] via [facility].  Pt daughter called staff around noon today stating pt had slurred speech during a phone conversation.  Pt reports to EMS she went to bed around 11 pm, feeling normal.  Pt states she "didn't feel right" when she woke up at 7 am.   Pt has right sided facial droop, mild numbness in fingers of right hand.  Equal grips.  Pt in NAD, A&O.

## 2014-08-22 DIAGNOSIS — I369 Nonrheumatic tricuspid valve disorder, unspecified: Secondary | ICD-10-CM

## 2014-08-22 DIAGNOSIS — I6340 Cerebral infarction due to embolism of unspecified cerebral artery: Principal | ICD-10-CM

## 2014-08-22 LAB — BASIC METABOLIC PANEL
ANION GAP: 12 (ref 5–15)
BUN: 30 mg/dL — ABNORMAL HIGH (ref 6–23)
CHLORIDE: 108 meq/L (ref 96–112)
CO2: 24 mEq/L (ref 19–32)
Calcium: 8.5 mg/dL (ref 8.4–10.5)
Creatinine, Ser: 1.55 mg/dL — ABNORMAL HIGH (ref 0.50–1.10)
GFR calc Af Amer: 34 mL/min — ABNORMAL LOW (ref >90)
GFR calc non Af Amer: 29 mL/min — ABNORMAL LOW (ref >90)
Glucose, Bld: 91 mg/dL (ref 70–99)
Potassium: 4.1 mEq/L (ref 3.7–5.3)
Sodium: 144 mEq/L (ref 137–147)

## 2014-08-22 LAB — LIPID PANEL
CHOL/HDL RATIO: 2 ratio
Cholesterol: 147 mg/dL (ref 0–200)
HDL: 74 mg/dL (ref >39)
LDL Cholesterol: 59 mg/dL (ref 0–99)
Triglycerides: 69 mg/dL (ref <150)
VLDL: 14 mg/dL (ref 0–40)

## 2014-08-22 LAB — HEMOGLOBIN A1C
Hgb A1c MFr Bld: 5.9 % — ABNORMAL HIGH (ref <5.7)
MEAN PLASMA GLUCOSE: 123 mg/dL — AB (ref <117)

## 2014-08-22 LAB — CBC
HCT: 37.5 % (ref 36.0–46.0)
Hemoglobin: 12.5 g/dL (ref 12.0–15.0)
MCH: 30 pg (ref 26.0–34.0)
MCHC: 33.3 g/dL (ref 30.0–36.0)
MCV: 90.1 fL (ref 78.0–100.0)
Platelets: 332 10*3/uL (ref 150–400)
RBC: 4.16 MIL/uL (ref 3.87–5.11)
RDW: 14 % (ref 11.5–15.5)
WBC: 7.5 10*3/uL (ref 4.0–10.5)

## 2014-08-22 LAB — PROTIME-INR
INR: 1.13 (ref 0.00–1.49)
Prothrombin Time: 14.6 seconds (ref 11.6–15.2)

## 2014-08-22 MED ORDER — CLOPIDOGREL BISULFATE 75 MG PO TABS
75.0000 mg | ORAL_TABLET | Freq: Every day | ORAL | Status: DC
  Administered 2014-08-23: 75 mg via ORAL
  Filled 2014-08-22: qty 1

## 2014-08-22 MED ORDER — CLOPIDOGREL BISULFATE 75 MG PO TABS: 75.0000 mg | ORAL_TABLET | Freq: Every day | ORAL | Status: DC

## 2014-08-22 MED ORDER — LIDOCAINE 5 % EX PTCH
1.0000 | MEDICATED_PATCH | CUTANEOUS | Status: DC | PRN
  Administered 2014-08-22 – 2014-08-24 (×3): 1 via TRANSDERMAL
  Filled 2014-08-22 (×3): qty 1

## 2014-08-22 NOTE — Progress Notes (Signed)
STROKE TEAM PROGRESS NOTE   HISTORY Kristina Aguirre is an 80 y.o. female history of hypertension, hypercholesterolemia, TIA and arthritis, brought to the emergency room for evaluation of new onset right facial droop and right hand numbness. Patient was last known well yesterday 08/20/2014. She woke up with current deficits at about 7 AM this morning 08/21/2014. She's been taking aspirin 81 mg per day. CT scan of the head showed no acute normality. MRI showed acute focal infarcts involving left frontal operculum none cortical white matter. NIH stroke score was 3.  Patient was not administered TPA secondary to unknown last seen well; beyond time window for treatment consideration. She was admitted for further evaluation and treatment.   SUBJECTIVE (INTERVAL HISTORY) Her daughter is at the bedside.  Overall she feels her condition is gradually improving - she still has language deficits per daughter. Patient is a resident at [facility] in [place], independent living. Her daughter lives in [place]. Her son-in-law is a ED MD at [facility] in [place].   OBJECTIVE Temp:  [97.6 F (36.4 C)-98.1 F (36.7 C)] 98.1 F (36.7 C) (11/09 1401) Pulse Rate:  [62-86] 70 (11/09 1401) Cardiac Rhythm:  [-]  Resp:  [18-20] 18 (11/09 1401) BP: (122-155)/(51-92) 138/60 mmHg (11/09 1401) SpO2:  [94 %-99 %] 98 % (11/09 1401)  No results for input(s): GLUCAP in the last 168 hours.  Recent Labs Lab 08/21/14 1305 08/21/14 1313 08/22/14 0408  NA 138 137 144  K 4.8 4.6 4.1  CL 99 104 108  CO2 25  --  24  GLUCOSE 107* 107* 91  BUN 41* 37* 30*  CREATININE 2.40* 2.50* 1.55*  CALCIUM 9.0  --  8.5    Recent Labs Lab 08/21/14 1305  AST 20  ALT 13  ALKPHOS 112  BILITOT 0.4  PROT 6.8  ALBUMIN 3.8    Recent Labs Lab 08/21/14 1305 08/21/14 1313 08/22/14 0408  WBC 11.3*  --  7.5  NEUTROABS 8.0*  --   --   HGB 13.3 14.3 12.5  HCT 39.6 42.0 37.5  MCV 90.0  --  90.1  PLT 342  --  332   No  results for input(s): CKTOTAL, CKMB, CKMBINDEX, TROPONINI in the last 168 hours.  Recent Labs  08/21/14 1305 08/22/14 0408  LABPROT 14.1 14.6  INR 1.08 1.13    Recent Labs  08/21/14 1354  COLORURINE YELLOW  LABSPEC 1.008  PHURINE 6.5  GLUCOSEU NEGATIVE  HGBUR MODERATE*  BILIRUBINUR NEGATIVE  KETONESUR NEGATIVE  PROTEINUR NEGATIVE  UROBILINOGEN 0.2  NITRITE NEGATIVE  LEUKOCYTESUR TRACE*       Component Value Date/Time   CHOL 147 08/22/2014 0408   TRIG 69 08/22/2014 0408   HDL 74 08/22/2014 0408   CHOLHDL 2.0 08/22/2014 0408   VLDL 14 08/22/2014 0408   LDLCALC 59 08/22/2014 0408   Lab Results  Component Value Date   HGBA1C 5.9* 08/22/2014      Component Value Date/Time   LABOPIA NONE DETECTED 08/21/2014 1354   COCAINSCRNUR NONE DETECTED 08/21/2014 1354   LABBENZ NONE DETECTED 08/21/2014 1354   AMPHETMU NONE DETECTED 08/21/2014 1354   THCU NONE DETECTED 08/21/2014 1354   LABBARB NONE DETECTED 08/21/2014 1354     Recent Labs Lab 08/21/14 1305  ETH <11    Ct Head Wo Contrast  08/21/2014   IMPRESSION: 1. No acute intracranial abnormality 2. Cerebral and cerebellar atrophy 3. Mild chronic microvascular ischemic white matter disease. 4. Intracranial atherosclerosis      Mra  Head Wo Contrast  08/21/2014    IMPRESSION: 1. Significantly limited study due to patient motion. 2. No evidence of major vessel occlusion or high-grade stenosis.     Mr Brain Wo Contrast  08/21/2014   IMPRESSION: 1. Acute/subacute focal infarcts involving the left frontal operculum and subcortical white matter as described. 2. Moderate generalized atrophy and diffuse white matter disease likely reflects the sequelae of chronic microvascular ischemia.    Carotid Doppler  There is 1-39% bilateral ICA stenosis. Vertebral artery flow is antegrade  2D echo -  - Left ventricle: The cavity size was normal. Systolic function was normal. The estimated ejection fraction was in the range of  55% to 60%. Wall motion was normal; there were no regional wall motion abnormalities. Doppler parameters are consistent with abnormal left ventricular relaxation (grade 1 diastolic dysfunction). - Aortic valve: There was mild regurgitation. - Mitral valve: There was mild to moderate regurgitation. - Left atrium: The atrium was mildly dilated. - Right ventricle: The cavity size was mildly dilated. Wall thickness was normal. - Right atrium: The atrium was moderately dilated. - Tricuspid valve: There was moderate regurgitation. - Pulmonary arteries: Systolic pressure was moderately increased.  Impressions: - No cardiac source of emboli was indentified.   PHYSICAL EXAM  Temp:  [97.6 F (36.4 C)-98.1 F (36.7 C)] 98.1 F (36.7 C) (11/09 1401) Pulse Rate:  [62-86] 70 (11/09 1401) Resp:  [18-20] 18 (11/09 1401) BP: (122-155)/(51-92) 138/60 mmHg (11/09 1401) SpO2:  [94 %-99 %] 98 % (11/09 1401)  General - thin, frail lady, well developed, in no apparent distress.  Ophthalmologic - not able to see through.  Cardiovascular - Regular rate and rhythm with no murmur.  Mental Status -  Level of arousal and orientation to time, place, and person were intact. Language including expression, naming, repetition, comprehension, reading was assessed and found intact. No dysarthria, but slower than normal speech speed.   Cranial Nerves II - XII - II - Visual field intact OU. III, IV, VI - Extraocular movements intact. V - Facial sensation intact bilaterally. VII - Facial movement intact bilaterally. VIII - Hearing & vestibular intact bilaterally. X - Palate elevates symmetrically. XI - Chin turning & shoulder shrug intact bilaterally. XII - Tongue protrusion intact.  Motor Strength - The patient's strength was 4+/5 in all extremities and pronator drift was absent.  Bulk was normal and fasciculations were absent.   Motor Tone - Muscle tone was assessed at the neck and appendages  and was normal.  Reflexes - The patient's reflexes were 1+ in all extremities and she had no pathological reflexes.  Sensory - Light touch, temperature/pinprick were assessed and were normal.    Coordination - The patient had normal movements in the hands with no ataxia or dysmetria.  Tremor was absent.  Gait and Station - deferred due to safety.    ASSESSMENT/PLAN Kristina Aguirre is a 80 y.o. female with history of hypertension, hypercholesterolemia, TIA and arthritis presenting with new onset right facial droop and right hand numbness. She did not receive IV t-PA due to unknown time of onset.   Stroke:  Dominant left frontal operculum and subcortical infarct, concerning for an embolic source  Resultant  Speech deficit largely resolved, mild slow in speech.  MRI  left frontal operculum and subcortical WM infarct   MRA  No significant stenosis   Carotid Doppler  No significant stenosis   2D Echo  unremarkable   HgbA1c 5.9  Heparin 5000 units sq  tid for VTE prophylaxis  aspirin 81 mg orally every day prior to admission, now on aspirin 81 mg orally every day and aspirin 325 mg orally every day. Given stroke on aspirin, recommend changed to Plavix 75 mg daily.  Ongoing aggressive risk factor management  Given advanced age and baseline condition, recommend outpatient telemetry monitoring to assess patient for atrial fibrillation as source of stroke. May be arranged with patient's cardiologist, or cardiologist of choice. Would not recommend TEE or loop at this time.  Therapy recommendations:  pending   Disposition:  pending   Hypertension  Home meds:   norvasc  Stable Permissive hypertension (OK if <220/120) for 24-48 hours post stroke and then gradually normalized within 5-7 days.  Hyperlipidemia  Home meds:  zocor 20 resumed in hospital  LDL 59, goal < 70  Continue statin at discharge  Other Stroke Risk Factors  Advanced age  ETOH use, 1 glass/wk . Hx  [facility] . Migraines  Other Active Problems  Post herpatic neuralgia s/p shingles  Hospital day # 1  SHARON BIBY, MSN, APRN, ANVP-BC, AGPCNP-BC [facility] Pager: [phone] 08/22/2014 6:04 PM   I, the attending vascular neurologist, have personally obtained a history, examined the patient, evaluated laboratory data, individually viewed imaging studies, and formulated the assessment and plan of care.  I have made any additions or clarifications directly to the above note and agree with the findings and plan as currently documented.   Please note: All text in blue color in the note is my addition to the original note.   Neurology will sign off. Please call with questions. Pt [facility] about 2 months. Thanks for the consult.  Marvel PlanJindong Nahia Nissan, MD PhD Stroke Neurology 08/22/2014 6:14 PM   To contact Stroke Continuity provider, please refer to [email or web address]. After hours, contact General Neurology

## 2014-08-22 NOTE — Evaluation (Signed)
Physical Therapy Evaluation Patient Details Name: Kristina Aguirre MRN: [id number] DOB: 06/11/1927 Today's Date: 08/22/2014   History of Present Illness  Patient is an 80 yo female admitted 08/21/14 with Rt facial droop, speech difficulties, and Rt hand numbness.  MRI showed Lt MCA infarction.  Patient with NIHSS of 3.  PMH:  HTN, HLD, TIA, CKD, arthritis, shingles, post herpetic neuralgia Rt side of neck  Clinical Impression  Patient presents with problems listed below.  Will benefit from acute PT to maximize independence prior to discharge.  Recommend SNF at discharge for continued PT and 24 hour assist due to decreased balance and safety with ambulation.    Follow Up Recommendations SNF;Supervision/Assistance - 24 hour    Equipment Recommendations  Rolling walker with 5" wheels    Recommendations for Other Services       Precautions / Restrictions Precautions Precautions: Fall Restrictions Weight Bearing Restrictions: No      Mobility  Bed Mobility Overal bed mobility: Needs Assistance Bed Mobility: Supine to Sit;Sit to Supine     Supine to sit: Min assist Sit to supine: Min assist   General bed mobility comments: Verbal cues for technique.  Assist to raise trunk to sitting.  Once upright, patient with good balance.    Transfers Overall transfer level: Needs assistance Equipment used: None Transfers: Sit to/from Stand Sit to Stand: Min assist         General transfer comment: Verbal cues for hand placement.  Assist to rise to standing from bed and toilet, and for balance and safety in standing.  Patient slightly unsteady.  Ambulation/Gait Ambulation/Gait assistance: Min assist Ambulation Distance (Feet): 28 Feet Assistive device: None Gait Pattern/deviations: Step-through pattern;Decreased step length - right;Decreased step length - left;Decreased stride length;Shuffle;Staggering left;Staggering right Gait velocity: Decreased Gait velocity interpretation: Below  normal speed for age/gender General Gait Details: Patient with unsteady gait, requiring assist to maintain balance.  Patient staggering to both sides during gait.  May benefit from assistive device.  Stairs            Wheelchair Mobility    Modified Rankin (Stroke Patients Only) Modified Rankin (Stroke Patients Only) Pre-Morbid Rankin Score: No symptoms Modified Rankin: Moderately severe disability     Balance Overall balance assessment: Needs assistance         Standing balance support: No upper extremity supported Standing balance-Leahy Scale: Fair Standing balance comment: Unsteady in stance and staggers during gait.                             Pertinent Vitals/Pain Pain Assessment: 0-10 Pain Score: 5  Pain Location: Rt side of neck Pain Descriptors / Indicators: Constant;Aching;Sore Pain Intervention(s): Repositioned    Home Living Family/patient expects to be discharged to:: [facility] Living Arrangements: Alone Available Help at Discharge: Family;Available 24 hours/day;Available PRN/intermittently (24 hours x3 days then prn) Type of Home: Independent living facility [facility] Home Access: Level entry     Home Layout: One level Home Equipment: None      Prior Function Level of Independence: Independent         Comments: Daughters provide transportation and assist as needed.     Hand Dominance   Dominant Hand: Right    Extremity/Trunk Assessment   Upper Extremity Assessment: Generalized weakness;Defer to OT evaluation (Arthritic changes in hands)           Lower Extremity Assessment: Generalized weakness  Cervical / Trunk Assessment: Kyphotic;Other exceptions  Communication   Communication: No difficulties  Cognition Arousal/Alertness: Awake/alert Behavior During Therapy: WFL for tasks assessed/performed Overall Cognitive Status: Within Functional Limits for tasks assessed                       General Comments      Exercises        Assessment/Plan    PT Assessment Patient needs continued PT services  PT Diagnosis Difficulty walking;Abnormality of gait;Generalized weakness   PT Problem List Decreased strength;Decreased activity tolerance;Decreased balance;Decreased mobility;Decreased knowledge of use of DME;Pain  PT Treatment Interventions DME instruction;Gait training;Functional mobility training;Therapeutic activities;Therapeutic exercise;Balance training;Patient/family education   PT Goals (Current goals can be found in the Care Plan section) Acute Rehab PT Goals Patient Stated Goal: To be able to return to her apartment PT Goal Formulation: With patient/family Time For Goal Achievement: 08/29/14 Potential to Achieve Goals: Good    Frequency Min 3X/week   Barriers to discharge Decreased caregiver support Does not have 24 hour supervision available    Co-evaluation               End of Session Equipment Utilized During Treatment: Gait belt Activity Tolerance: Patient limited by fatigue Patient left: in bed;with call bell/phone within reach;with family/visitor present Nurse Communication: Mobility status         Time: [id number] PT Time Calculation (min): 24 min   Charges:   PT Evaluation $Initial PT Evaluation Tier I: 1 Procedure PT Treatments $Gait Training: 8-22 mins   PT G Codes:          Vena AustriaDavis, Callahan Peddie H 08/22/2014, 5:04 PM Durenda HurtSusan H. Renaldo Fiddleravis, PT, [facility] Acute Rehab Services Pager [phone]

## 2014-08-22 NOTE — Progress Notes (Signed)
  Echocardiogram 2D Echocardiogram has been performed.  Arvil ChacoFoster, Cassady Stanczak 08/22/2014, 11:11 AM

## 2014-08-22 NOTE — Progress Notes (Signed)
  Date: 08/22/2014  Patient name: Kristina Aguirre  Medical record number: [id number]  Date of birth: 07/20/1927   This patient has been seen and the plan of care was discussed with the house staff. Please see their note for complete details. I concur with their findings with the following additions/corrections:  See H & P.  She appears to be improving- speech clearer, pt reports some decrease in her R hand numbness.  Appreciate Neuro f/u.  Will work on clarifying her "code status" with her family.  Probable back to SNF-assisted living soon.   Ginnie SmartJeffrey C Alfonsa Vaile, MD 08/22/2014, 11:46 AM

## 2014-08-22 NOTE — Progress Notes (Signed)
CARE MANAGEMENT NOTE 08/22/2014  Patient:  Kristina Aguirre,Kristina Aguirre   Account Number:  [id number]  Date Initiated:  08/22/2014  Documentation initiated by:  Jiles CrockerHANDLER,Sharnita Bogucki  Subjective/Objective Assessment:   ADMITTED FOR STROKE     Action/Plan:   CM FOLLOWING FOR DCP   Anticipated DC Date:  08/26/2014   Anticipated DC Plan:  AWAITING FOR PT/OT EVALS FOR DISPOSITION NEEDS     DC Planning Services  CM consult              Status of service:  In process, will continue to follow Medicare Important Message given?   (If response is "NO", the following Medicare IM given date fields will be blank)  Per UR Regulation:  Reviewed for med. necessity/level of care/duration of stay  Comments:  11/9/2015[name]- B Kristina Mccaffery RN,BSN,MHA [phone]

## 2014-08-22 NOTE — Progress Notes (Addendum)
Subjective:  Patient was seen and examined this morning. Patient seems more quiet and reserved this morning as compared to last night. She states she doesn't feel the same as normal, but cannot describe how or what she feels. She does admit to pain on the right side of her face and neck. The lidocaine patch helped yesterday. Patient states she continues to have numbness in the medial 3 fingers (3, 4, 5) of her right hand, mildly improved from yesterday. Patient does admit to some trouble speaking and daughter, Lurena JoinerRebecca, states that her speech seems to be more halting than baseline.   Objective: Vital signs in last 24 hours: Filed Vitals:   08/22/14 0100 08/22/14 0300 08/22/14 0500 08/22/14 0938  BP: 122/88 134/62 136/68 133/51  Pulse: 68 68 72 62  Temp: 97.9 F (36.6 C) 98 F (36.7 C) 98 F (36.7 C) 97.6 F (36.4 C)  TempSrc: Oral Oral Oral Oral  Resp: 18 20 18 18   Height:      Weight:      SpO2: 96% 95% 95% 97%   General: Vital signs reviewed. Patient is well-developed and well-nourished, in no acute distress and cooperative with exam.  Eyes: EOMI, conjunctivae normal, no scleral icterus.  Neck: Chronic flexion. No carotid bruit present.  Cardiovascular: RRR. 3/6 systolic ejection murmur, S1 normal, S2 normal, no gallops, or rubs. Pulmonary/Chest: Clear to auscultation bilaterally, no wheezes, rales, or rhonchi. Abdominal: Soft, non-tender, non-distended, BS +, no masses, organomegaly, or guarding present.  Musculoskeletal: No joint deformities, erythema, or stiffness, ROM full and nontender. Extremities: No lower extremity edema bilaterally, pulses symmetric and intact bilaterally. No cyanosis or clubbing. Skin: Warm, dry and intact. No rashes or erythema. Psychiatric: Normal mood and affect. speech and behavior is normal. Cognition and memory are normal.  Neurological: Numbness in medial 3 fingers (3, 4, 5) of right hand, slight right facial droop and mild dysarthria.  Lab  Results: Basic Metabolic Panel:  Recent Labs Lab 08/21/14 1305 08/21/14 1313 08/22/14 0408  NA 138 137 144  K 4.8 4.6 4.1  CL 99 104 108  CO2 25  --  24  GLUCOSE 107* 107* 91  BUN 41* 37* 30*  CREATININE 2.40* 2.50* 1.55*  CALCIUM 9.0  --  8.5   Liver Function Tests:  Recent Labs Lab 08/21/14 1305  AST 20  ALT 13  ALKPHOS 112  BILITOT 0.4  PROT 6.8  ALBUMIN 3.8   CBC:  Recent Labs Lab 08/21/14 1305 08/21/14 1313 08/22/14 0408  WBC 11.3*  --  7.5  NEUTROABS 8.0*  --   --   HGB 13.3 14.3 12.5  HCT 39.6 42.0 37.5  MCV 90.0  --  90.1  PLT 342  --  332   Fasting Lipid Panel:  Recent Labs Lab 08/22/14 0408  CHOL 147  HDL 74  LDLCALC 59  TRIG 69  CHOLHDL 2.0   Coagulation:  Recent Labs Lab 08/21/14 1305 08/22/14 0408  LABPROT 14.1 14.6  INR 1.08 1.13   Urine Drug Screen: Drugs of Abuse     Component Value Date/Time   LABOPIA NONE DETECTED 08/21/2014 1354   COCAINSCRNUR NONE DETECTED 08/21/2014 1354   LABBENZ NONE DETECTED 08/21/2014 1354   AMPHETMU NONE DETECTED 08/21/2014 1354   THCU NONE DETECTED 08/21/2014 1354   LABBARB NONE DETECTED 08/21/2014 1354    Alcohol Level:  Recent Labs Lab 08/21/14 1305  ETH <11   Urinalysis:  Recent Labs Lab 08/21/14 1354  COLORURINE YELLOW  LABSPEC  1.008  PHURINE 6.5  GLUCOSEU NEGATIVE  HGBUR MODERATE*  BILIRUBINUR NEGATIVE  KETONESUR NEGATIVE  PROTEINUR NEGATIVE  UROBILINOGEN 0.2  NITRITE NEGATIVE  LEUKOCYTESUR TRACE*   Studies/Results: Ct Head Wo Contrast  08/21/2014   CLINICAL DATA:  80 year old female with right-sided facial droop and slurred speech  EXAM: CT HEAD WITHOUT CONTRAST  TECHNIQUE: Contiguous axial images were obtained from the base of the skull through the vertex without intravenous contrast.  COMPARISON:  None.  FINDINGS: Negative for acute intracranial hemorrhage, acute infarction, mass, mass effect, hydrocephalus or midline shift. Gray-white differentiation is preserved  throughout. Visualized globes are symmetric and unremarkable bilaterally. No focal soft tissue or calvarial abnormality. Mild global cerebral and cerebellar atrophy. Very mild periventricular white matter hypoattenuation consistent was sequelae of longstanding microvascular ischemic white matter disease. Atherosclerotic calcifications present in both cavernous carotid arteries. Normal aeration of the mastoid air cells and visualized paranasal sinuses.  IMPRESSION: 1. No acute intracranial abnormality 2. Cerebral and cerebellar atrophy 3. Mild chronic microvascular ischemic white matter disease. 4. Intracranial atherosclerosis   Electronically Signed   By: Malachy Moan M.D.   On: 08/21/2014 13:24   Mr Maxine Glenn Head Wo Contrast  08/21/2014   CLINICAL DATA:  Acute embolic stroke.  EXAM: MRA HEAD WITHOUT CONTRAST  TECHNIQUE: Angiographic images of the Circle of Willis were obtained using MRA technique without intravenous contrast.  COMPARISON:  Brain MRI from earlier the same day  FINDINGS: Patient motion results in a significant decrease in diagnostic sensitivity; aneurysms or stenotic lesions could easily be obscured. 3D acquisition was attempted, but of no added benefit.  Strongly left dominant posterior circulation. The vertebrobasilar system is tortuous but not aneurysmal. The PICAs are grossly patent. Superior cerebellar arteries are visible bilaterally. Symmetric appearance of the posterior cerebral arteries which are patent at least the P3 segments.  Balanced anterior circulation. No evidence for major vessel occlusion (with sensitivity significantly decreased beyond the M1 segments) or high-grade central stenosis.  No evidence of aneurysm.  IMPRESSION: 1. Significantly limited study due to patient motion. 2. No evidence of major vessel occlusion or high-grade stenosis.   Electronically Signed   By: Tiburcio Pea M.D.   On: 08/21/2014 18:43   Mr Brain Wo Contrast  08/21/2014   CLINICAL DATA:  Patient  awoke with focal right hand numbness. She is head slurred speech during the day. Right-sided facial droop.  EXAM: MRI HEAD WITHOUT CONTRAST  TECHNIQUE: Multiplanar, multiecho pulse sequences of the brain and surrounding structures were obtained without intravenous contrast.  COMPARISON:  CT head without contrast from the same day.  FINDINGS: A punctate area of cortical restricted diffusion is present in the left frontal operculum. There is cortical and subjacent white matter restricted diffusion more medially in the left frontal operculum as well, evident on image 23 of series 5 and image 18 of series 8. Minimal T2 changes are associated with these areas.  Moderate generalized atrophy and diffuse white matter changes are present bilaterally. Flow is present in the major intracranial arteries. Patient is status post bilateral lens replacements. The paranasal sinuses and mastoid air cells are clear.  IMPRESSION: 1. Acute/subacute focal infarcts involving the left frontal operculum and subcortical white matter as described. 2. Moderate generalized atrophy and diffuse white matter disease likely reflects the sequelae of chronic microvascular ischemia.   Electronically Signed   By: Gennette Pac M.D.   On: 08/21/2014 15:23   Medications:  I have reviewed the patient's current medications. Prior to Admission:  Prescriptions prior to admission  Medication Sig Dispense Refill Last Dose  . amLODipine (NORVASC) 2.5 MG tablet Take 2.5 mg by mouth daily.   08/21/2014 at Unknown time  . aspirin EC 81 MG tablet Take 81 mg by mouth at bedtime.   08/20/2014 at Unknown time  . gabapentin (NEURONTIN) 400 MG capsule Take 400 mg by mouth 3 (three) times daily. Morning, 3pm and bedtime   08/21/2014 at Unknown time  . lidocaine (LIDODERM) 5 % Place 1 patch onto the skin daily. Remove & Discard patch within 12 hours or as directed by MD   08/20/2014 at Unknown time  . loratadine (CLARITIN) 10 MG tablet Take 10 mg by mouth daily.    08/21/2014 at Unknown time  . OVER THE COUNTER MEDICATION Place 1 drop into both eyes at bedtime. Over the counter eye drops for dry eyes   08/20/2014 at Unknown time  . simvastatin (ZOCOR) 20 MG tablet Take 20 mg by mouth at bedtime.    08/20/2014 at Unknown time  . traMADol (ULTRAM) 50 MG tablet Take 50-100 mg by mouth 3 (three) times daily. Take 1 tablet (50 mg) every morning and at bedtime, take 2 tablets (100 mg) daily at 3pm   08/21/2014 at Unknown time   Scheduled Meds: . aspirin EC  81 mg Oral QHS  . aspirin  325 mg Oral Daily  . gabapentin  400 mg Oral 3 times per day  . heparin subcutaneous  5,000 Units Subcutaneous 3 times per day  . simvastatin  20 mg Oral QHS  . traMADol  50 mg Oral TID   Continuous Infusions:  PRN Meds:.lidocaine, senna-docusate Assessment/Plan: Active Problems:   CVA (cerebral vascular accident)   Essential hypertension   Acute embolic stroke  Acute Left MCA CVA: Patient continues to have numbness, facial droop and dysarthria. Lipid panel is within normal limits: cholesterol 147, TG 69, HDL 74, LDL 59. MRA head was significantly limited study due to patient motion, but patient had no evidence of major vessel occlusion or high-grade stenosis. Carotid Dopplers showed no significant ICA stenosis 1-39% bilaterally, however test was limited due to head position. Echo showed no embolic source, grade 1 diastolic dysfunction, EF 60-65%. I discussed with the family about antiplatelet and statin therapy. The patient and the family would like to try Plavix instead of Aspirin. They would like to continue her current regimen of simvastatin 20 mg daily (low intensity) instead of the recommended high intensity statin because of myalgias. Patient has not been evaluated by PT and patient's daughter is requesting the patient stay one more night as there is no one to take care of her tonight at home. -Plavix 75 mg daily -Simvastatin 20 mg daily -Telemetry monitoring -Aspiration  precautions -Bed rest -Neuro checks Q2H -PT/OT -Neurology following, appreciate recommendations  HTN: Blood pressure 130s/60s. Patient is normally on amlodipine 2.5 mg daily at home. We will allow for permissive hypertension for 24-48 hours.  -Allow for permissive hypertension   HLD: Patient has history of hyperlipidemia, but there are no records of a lipid panel in our system. Patient is on simvastatin 20 mg daily at home. Lipid panel is within normal limits: cholesterol 147, TG 69, HDL 74, LDL 59 -Simvastatin 20 mg daily   Post-herpetic Neuralgia: Patient has post-herpetic neuralgia on the right side of her face, right ear and right lateral neck as a result of a shingles outbreak 2 years ago. Lidocaine patch helped patient yesterday.  -Tramadol 50 mg QAM and QHS -  Tramadol 100 mg at lunchtime -Gabapentin 400 mg TID -Lidocaine patch as needed  DVT/PE ppx: Heparin SQ TID  Dispo: Disposition is deferred at this time, awaiting improvement of current medical problems.  Anticipated discharge in approximately 1-2 day(s).   The patient does have a current PCP Jerl Mina(James Hedrick, MD) and does not [facility] after discharge.  The patient does have transportation limitations that hinder transportation to clinic appointments.  .Services Needed at time of discharge: Y = Yes, Blank = No PT:   OT:   RN:   Equipment:   Other:     LOS: 1 day   Jill AlexandersAlexa Richardson, DO PGY-1 Internal Medicine Resident Pager # [phone] 08/22/2014 11:08 AM

## 2014-08-22 NOTE — Progress Notes (Signed)
*  PRELIMINARY RESULTS* Vascular Ultrasound Carotid Duplex (Doppler) has been completed.  Preliminary findings: Technically limited due to patient's head being positioned to the chest. Appears to be no significant ICA stenosis, 1-39% range, bilaterally.   Farrel DemarkJill Eunice, RDMS, RVT  08/22/2014, 9:59 AM

## 2014-08-23 DIAGNOSIS — I639 Cerebral infarction, unspecified: Secondary | ICD-10-CM

## 2014-08-23 DIAGNOSIS — I480 Paroxysmal atrial fibrillation: Secondary | ICD-10-CM

## 2014-08-23 DIAGNOSIS — B0229 Other postherpetic nervous system involvement: Secondary | ICD-10-CM

## 2014-08-23 DIAGNOSIS — E785 Hyperlipidemia, unspecified: Secondary | ICD-10-CM

## 2014-08-23 DIAGNOSIS — I10 Essential (primary) hypertension: Secondary | ICD-10-CM

## 2014-08-23 LAB — CBC
HCT: 40.5 % (ref 36.0–46.0)
Hemoglobin: 13.6 g/dL (ref 12.0–15.0)
MCH: 30.3 pg (ref 26.0–34.0)
MCHC: 33.6 g/dL (ref 30.0–36.0)
MCV: 90.2 fL (ref 78.0–100.0)
PLATELETS: 292 10*3/uL (ref 150–400)
RBC: 4.49 MIL/uL (ref 3.87–5.11)
RDW: 13.9 % (ref 11.5–15.5)
WBC: 9.2 10*3/uL (ref 4.0–10.5)

## 2014-08-23 LAB — BASIC METABOLIC PANEL
ANION GAP: 13 (ref 5–15)
BUN: 27 mg/dL — ABNORMAL HIGH (ref 6–23)
CALCIUM: 8.8 mg/dL (ref 8.4–10.5)
CO2: 23 mEq/L (ref 19–32)
CREATININE: 1.38 mg/dL — AB (ref 0.50–1.10)
Chloride: 106 mEq/L (ref 96–112)
GFR calc non Af Amer: 34 mL/min — ABNORMAL LOW (ref >90)
GFR, EST AFRICAN AMERICAN: 39 mL/min — AB (ref >90)
Glucose, Bld: 91 mg/dL (ref 70–99)
Potassium: 4.3 mEq/L (ref 3.7–5.3)
Sodium: 142 mEq/L (ref 137–147)

## 2014-08-23 LAB — TSH: TSH: 5.86 u[IU]/mL — ABNORMAL HIGH (ref 0.350–4.500)

## 2014-08-23 LAB — TROPONIN I

## 2014-08-23 MED ORDER — APIXABAN 2.5 MG PO TABS
2.5000 mg | ORAL_TABLET | Freq: Two times a day (BID) | ORAL | Status: DC
  Administered 2014-08-23 – 2014-08-24 (×3): 2.5 mg via ORAL
  Filled 2014-08-23 (×2): qty 1

## 2014-08-23 MED ORDER — AMLODIPINE BESYLATE 2.5 MG PO TABS
2.5000 mg | ORAL_TABLET | Freq: Every day | ORAL | Status: DC
  Administered 2014-08-24: 2.5 mg via ORAL
  Filled 2014-08-23: qty 1

## 2014-08-23 NOTE — Progress Notes (Addendum)
STROKE TEAM PROGRESS NOTE   HISTORY Kristina Aguirre is an 80 y.o. female history of hypertension, hypercholesterolemia, TIA and arthritis, brought to the emergency room for evaluation of new onset right facial droop and right hand numbness. Patient was last known well yesterday 08/20/2014. She woke up with current deficits at about 7 AM this morning 08/21/2014. She's been taking aspirin 81 mg per day. CT scan of the head showed no acute normality. MRI showed acute focal infarcts involving left frontal operculum none cortical white matter. NIH stroke score was 3.  Patient was not administered TPA secondary to unknown last seen well; beyond time window for treatment consideration. She was admitted for further evaluation and treatment.   SUBJECTIVE (INTERVAL HISTORY) Her daughter is at the bedside.  She was found to have afib on tele, which is most likely the cause for her embolic stroke. Recommend eliquis 2.5mg  bid.  OBJECTIVE Temp:  [97.5 F (36.4 C)-98.9 F (37.2 C)] 98.8 F (37.1 C) (11/10 1050) Pulse Rate:  [51-73] 59 (11/10 1050) Cardiac Rhythm:  [-]  Resp:  [18-20] 18 (11/10 1050) BP: (112-169)/(45-82) 118/62 mmHg (11/10 1050) SpO2:  [94 %-98 %] 96 % (11/10 1050)  No results for input(s): GLUCAP in the last 168 hours.  Recent Labs Lab 08/21/14 1305 08/21/14 1313 08/22/14 0408 08/23/14 0438  NA 138 137 144 142  K 4.8 4.6 4.1 4.3  CL 99 104 108 106  CO2 25  --  24 23  GLUCOSE 107* 107* 91 91  BUN 41* 37* 30* 27*  CREATININE 2.40* 2.50* 1.55* 1.38*  CALCIUM 9.0  --  8.5 8.8    Recent Labs Lab 08/21/14 1305  AST 20  ALT 13  ALKPHOS 112  BILITOT 0.4  PROT 6.8  ALBUMIN 3.8    Recent Labs Lab 08/21/14 1305 08/21/14 1313 08/22/14 0408 08/23/14 0438  WBC 11.3*  --  7.5 9.2  NEUTROABS 8.0*  --   --   --   HGB 13.3 14.3 12.5 13.6  HCT 39.6 42.0 37.5 40.5  MCV 90.0  --  90.1 90.2  PLT 342  --  332 292    Recent Labs Lab 08/23/14 0438  TROPONINI <0.30     Recent Labs  08/21/14 1305 08/22/14 0408  LABPROT 14.1 14.6  INR 1.08 1.13    Recent Labs  08/21/14 1354  COLORURINE YELLOW  LABSPEC 1.008  PHURINE 6.5  GLUCOSEU NEGATIVE  HGBUR MODERATE*  BILIRUBINUR NEGATIVE  KETONESUR NEGATIVE  PROTEINUR NEGATIVE  UROBILINOGEN 0.2  NITRITE NEGATIVE  LEUKOCYTESUR TRACE*       Component Value Date/Time   CHOL 147 08/22/2014 0408   TRIG 69 08/22/2014 0408   HDL 74 08/22/2014 0408   CHOLHDL 2.0 08/22/2014 0408   VLDL 14 08/22/2014 0408   LDLCALC 59 08/22/2014 0408   Lab Results  Component Value Date   HGBA1C 5.9* 08/22/2014      Component Value Date/Time   LABOPIA NONE DETECTED 08/21/2014 1354   COCAINSCRNUR NONE DETECTED 08/21/2014 1354   LABBENZ NONE DETECTED 08/21/2014 1354   AMPHETMU NONE DETECTED 08/21/2014 1354   THCU NONE DETECTED 08/21/2014 1354   LABBARB NONE DETECTED 08/21/2014 1354     Recent Labs Lab 08/21/14 1305  ETH <11    Ct Head Wo Contrast  08/21/2014   IMPRESSION: 1. No acute intracranial abnormality 2. Cerebral and cerebellar atrophy 3. Mild chronic microvascular ischemic white matter disease. 4. Intracranial atherosclerosis      Mra Head Wo Contrast  08/21/2014    IMPRESSION: 1. Significantly limited study due to patient motion. 2. No evidence of major vessel occlusion or high-grade stenosis.     Mr Brain Wo Contrast  08/21/2014   IMPRESSION: 1. Acute/subacute focal infarcts involving the left frontal operculum and subcortical white matter as described. 2. Moderate generalized atrophy and diffuse white matter disease likely reflects the sequelae of chronic microvascular ischemia.    Carotid Doppler  There is 1-39% bilateral ICA stenosis. Vertebral artery flow is antegrade  2D echo -  - Left ventricle: The cavity size was normal. Systolic function was normal. The estimated ejection fraction was in the range of 55% to 60%. Wall motion was normal; there were no regional wall motion  abnormalities. Doppler parameters are consistent with abnormal left ventricular relaxation (grade 1 diastolic dysfunction). - Aortic valve: There was mild regurgitation. - Mitral valve: There was mild to moderate regurgitation. - Left atrium: The atrium was mildly dilated. - Right ventricle: The cavity size was mildly dilated. Wall thickness was normal. - Right atrium: The atrium was moderately dilated. - Tricuspid valve: There was moderate regurgitation. - Pulmonary arteries: Systolic pressure was moderately increased.  Impressions: - No cardiac source of emboli was indentified.   PHYSICAL EXAM  Temp:  [97.5 F (36.4 C)-98.9 F (37.2 C)] 98.8 F (37.1 C) (11/10 1050) Pulse Rate:  [51-73] 59 (11/10 1050) Resp:  [18-20] 18 (11/10 1050) BP: (112-169)/(45-82) 118/62 mmHg (11/10 1050) SpO2:  [94 %-98 %] 96 % (11/10 1050)  General - thin, frail lady, well developed, in no apparent distress.  Ophthalmologic - not able to see through.  Cardiovascular - Regular rate and rhythm with no murmur.  Mental Status -  Level of arousal and orientation to time, place, and person were intact. Language including expression, naming, repetition, comprehension, reading was assessed and found intact. No dysarthria, but slower than normal speech speed.   Cranial Nerves II - XII - II - Visual field intact OU. III, IV, VI - Extraocular movements intact. V - Facial sensation intact bilaterally. VII - Facial movement intact bilaterally. VIII - Hearing & vestibular intact bilaterally. X - Palate elevates symmetrically. XI - Chin turning & shoulder shrug intact bilaterally. XII - Tongue protrusion intact.  Motor Strength - The patient's strength was 4+/5 in all extremities and pronator drift was absent.  Bulk was normal and fasciculations were absent.   Motor Tone - Muscle tone was assessed at the neck and appendages and was normal.  Reflexes - The patient's reflexes were 1+ in all  extremities and she had no pathological reflexes.  Sensory - Light touch, temperature/pinprick were assessed and were normal.    Coordination - The patient had normal movements in the hands with no ataxia or dysmetria.  Tremor was absent.  Gait and Station - deferred due to safety.    ASSESSMENT/PLAN Kristina Aguirre is a 80 y.o. female with history of hypertension, hypercholesterolemia, TIA and arthritis presenting with new onset right facial droop and right hand numbness. She did not receive IV t-PA due to unknown time of onset.   Stroke:  Dominant left frontal operculum and subcortical infarct secondary to new diagnosed afib  Resultant  Speech deficit largely resolved.  MRI  left frontal operculum and subcortical WM infarct   MRA  No significant stenosis   Carotid Doppler  No significant stenosis   2D Echo  unremarkable   HgbA1c 5.9  Heparin 5000 units sq tid for VTE prophylaxis  aspirin 81 mg  orally every day prior to admission, now on eliquis 2.5mg  bid for stroke prevention.   Ongoing aggressive risk factor management  Therapy recommendations:  SNF   Disposition:  SNF   Afib, newly diagnosed - likely the cause for stroke - on eliquis 2.5mg  bid - please d/c plavix - continue rate control.  Hypertension  Home meds: norvasc  Stable Permissive hypertension (OK if <220/120) for 24-48 hours post stroke and then gradually normalized within 5-7 days.  Hyperlipidemia  Home meds:  zocor 20 resumed in hospital  LDL 59, goal < 70  Continue statin at discharge  Other Stroke Risk Factors  Advanced age  ETOH use, 1 glass/wk . [facility] . Migraines  Other Active Problems  Post herpatic neuralgia s/p shingles  Hospital day # 2  Neurology will sign off. Please call with questions. [facility] 2 months. Thanks for the consult.  Marvel PlanJindong Corine Solorio, MD PhD Stroke Neurology 08/23/2014 1:00 PM   To contact  Stroke Continuity provider, please refer to [email or web address]. After hours, contact General Neurology

## 2014-08-23 NOTE — Clinical Social Work Psychosocial (Signed)
Clinical Social Work Department BRIEF PSYCHOSOCIAL ASSESSMENT 08/23/2014  Patient:  Kristina Aguirre, Kristina Aguirre     Account Number:  [id number]     Admit date:  08/21/2014  Clinical Social Worker:  Marciano Sequin  Date/Time:  08/23/2014 01:01 PM  Referred by:  RN  Date Referred:  08/23/2014 Referred for  SNF Placement   Other Referral:   Interview type:  Patient Other interview type:    PSYCHOSOCIAL DATA Living Status:  FACILITY Admitted from facility:  [facility] Level of care:  Independent Living Primary support name:  Alson,[name] Primary support relationship to patient:  CHILD, ADULT Degree of support available:   Strong Support System    CURRENT CONCERNS Current Concerns  None Noted   Other Concerns:    SOCIAL WORK ASSESSMENT / PLAN CSW met the pt at bedside. CSW introduced self and purpose of the visit. CSW discussed the clinical team's recommendations for rehab. Pt reported being an independent living resident at [facility]. Pt reported that she would be pleased receiving rehab from their skilled unit at [facility]. CSW explained the SNF process to the pt. CSW provided the pt with contact information for further questions. CSW will continue to follow this pt and assist with discharge as needed.   Assessment/plan status:  Psychosocial Support/Ongoing Assessment of Needs Other assessment/ plan:   Information/referral to community resources:    PATIENT'S/FAMILY'S RESPONSE TO PLAN OF CARE: Pt presented with a happy affact and mood. Pt reported being ready and eager to start therapy, so she can transition back home. Pt is in an agrees to going to skilled unit at [facility].    [place], MSW, [place]

## 2014-08-23 NOTE — Progress Notes (Signed)
IMTS Night float progress note  S: Telemetry notified of patient going into atrial fibrillation with rates in the 80s. Upon interview, patient denies any chest pain or significant symptoms. Patient reports potentially having some lightheadedness when she had been up to use the bathroom, but she also has noted this in the past as well. She states that this has not been significantly worse than her previous episodes, and states that she is overall feeling much better than previous days of her admission. Patient denies any history of heart arrhythmias, atrial fibrillation, significant episodes of bleeding. By the time of arrival at patient's room, patient has reverted back to sinus rhythm.  O: Filed Vitals:   08/23/14 0417  BP: 169/82  Pulse: 73  Temp: 97.5 F (36.4 C)  Resp: 18   General: resting in bed HEENT: PERRL, EOMI, no scleral icterus Cardiac: RRR, no rubs, murmurs or gallops Pulm: clear to auscultation bilaterally, moving normal volumes of air Ext: warm and well perfused, no pedal edema Neuro: alert and oriented X3  - cranial nerves II through XII intact  - motor: Strength 5+ throughout all 4 extremities  - Sensation: Intact to light touch throughout all 4 extremities  - Reflexes: brisk deep tendon reflexes in bilateral upper extremities, 2+ patellar reflexes bilaterally  - cerebellar: Finger to nose and heel to shin intact  Skin: no rashes or lesions noted Psych: appropriate affect and cognition  EKG: Normal sinus rhythm with sinus arrhythmia, left axis deviation, left bundle branch block unchanged from prior on 08/21/2014  A/P: 80 year old admitted for acute stroke now with paroxysmal atrial fibrillation noted on telemetry. Echocardiogram performed on November 9 without any evidence of thrombus. Patient has a high risk a stroke from atrial fibrillation with a CHA2DS2-Vasc score of 6, but also has a high risk of bleeding with a HASBLED score of 3. Patient does not appear to have  any clear etiologies for new onset atrial fibrillation. It is unclear whether this is truly a new episode, or represents an ongoing paroxysmal atrial fibrillation, that could have possibly been the etiology of the original presenting CVA.  - will defer decision on anticoagulation to day team given high risk of bleeding (hypertension, dual antiplatelet therapy, age, recent history of CVA)  - TSH, CBC, BMP, troponin

## 2014-08-23 NOTE — Evaluation (Signed)
Speech Language Pathology Evaluation Patient Details Name: Kristina Aguirre MRN: [id number] DOB: 04/21/1927 Today's Date: 08/23/2014 Time: 1142-1208 SLP Time Calculation (min) (ACUTE ONLY): 26 min  Problem List:  Patient Active Problem List   Diagnosis Date Noted  . CVA (cerebral vascular accident) 08/21/2014  . Essential hypertension 08/21/2014  . Acute embolic stroke 08/21/2014  . Postherpetic neuralgia 08/21/2014  . HLD (hyperlipidemia) 08/21/2014   Past Medical History:  Past Medical History  Diagnosis Date  . Hypertension   . High cholesterol   . TIA (transient ischemic attack)   . Renal disorder   . Hematuria   . Arthritis   . Post herpetic neuralgia     Right side of neck  . Shingles    Past Surgical History:  Past Surgical History  Procedure Laterality Date  . Tubal ligation     HPI:  Patient is an 80 yo female admitted 08/21/14 with Rt facial droop, speech difficulties, and Rt hand numbness. MRI showed Lt MCA infarction. Patient with NIHSS of 3. PMH: HTN, HLD, TIA, CKD, arthritis, shingles, post herpetic neuralgia Rt side of neck   Assessment / Plan / Recommendation Clinical Impression  Pt's cognitive-linguistic function is WFL, and speech is intelligible at the conversational level throughout casual conversation and cognitively demanding tasks. Pt and her daughter report that she is very close to her baseline, although she still believes that she may be a little slower to get words out. Do not feel as though patient needs acute SLP services at this time, however should patient return home and still find that her speech is not completely returned to baseline, [facility].    SLP Assessment  All further Speech Lanaguage Pathology  needs can be addressed in the next venue of care    Follow Up Recommendations  Home health SLP (should speech not return to baseline completely)    Frequency and Duration        Pertinent Vitals/Pain  Pain Assessment: No/denies pain Pain Score: 5  Pain Location: R side neck Pain Descriptors / Indicators: Constant Pain Intervention(s): Repositioned   SLP Goals  Patient/Family Stated Goal: none stated  SLP Evaluation Prior Functioning  Cognitive/Linguistic Baseline: Within functional limits Type of Home: Independent living facility   Cognition  Overall Cognitive Status: Within Functional Limits for tasks assessed    Comprehension  Auditory Comprehension Overall Auditory Comprehension: Appears within functional limits for tasks assessed Visual Recognition/Discrimination Discrimination: Within Function Limits Reading Comprehension Reading Status: Within funtional limits    Expression Expression Primary Mode of Expression: Verbal Verbal Expression Overall Verbal Expression: Appears within functional limits for tasks assessed Written Expression Dominant Hand: Right Written Expression: Within Functional Limits   Oral / Motor Oral Motor/Sensory Function Overall Oral Motor/Sensory Function: Impaired Labial ROM: Reduced right Labial Symmetry: Abnormal symmetry right (VERY mild) Labial Strength: Within Functional Limits Lingual ROM: Within Functional Limits Lingual Symmetry: Within Functional Limits Lingual Strength: Within Functional Limits Facial ROM: Reduced right Facial Symmetry: Right droop (mild) Mandible: Within Functional Limits Motor Speech Overall Motor Speech: Impaired Respiration: Within functional limits Phonation: Normal Resonance: Within functional limits Articulation: Impaired Level of Impairment: Conversation Intelligibility: Intelligible Motor Planning: Witnin functional limits Motor Speech Errors: Not applicable   GO      Kristina Aguirre, Kristina Aguirre [phone]  Kristina Aguirre, Kristina Aguirre 08/23/2014, 1:07 PM

## 2014-08-23 NOTE — Progress Notes (Signed)
Subjective:  Patient was seen and examined this morning. Patient feels well. Her numbness, facial droop and speech have improved back to baseline. She has been eating and drinking well.  Overnight events: Patient had an episode of atrial fibrillation, undiagnosed, picked up on telemetry and on EKG. Patient seen by PT who recommends SNF and rolling walker.   Objective: Vital signs in last 24 hours: Filed Vitals:   08/23/14 0134 08/23/14 0417 08/23/14 0624 08/23/14 1050  BP: 112/59 169/82 152/65 118/62  Pulse: 51 73 64 59  Temp: 97.5 F (36.4 C) 97.5 F (36.4 C) 97.5 F (36.4 C) 98.8 F (37.1 C)  TempSrc: Oral Oral Oral Oral  Resp: 18 18 18 18   Height:      Weight:      SpO2: 94% 96% 95% 96%   General: Vital signs reviewed. Patient is well-developed and well-nourished, in no acute distress and cooperative with exam.  Eyes: EOMI, conjunctivae normal, no scleral icterus.  Neck: Chronic flexion. No carotid bruit present.  Cardiovascular: RRR. 3/6 systolic ejection murmur, S1 normal, S2 normal, no gallops, or rubs. Pulmonary/Chest: Clear to auscultation bilaterally, no wheezes, rales, or rhonchi. Abdominal: Soft, non-tender, non-distended, BS +, no masses, organomegaly, or guarding present.  Musculoskeletal: No joint deformities, erythema, or stiffness, ROM full and nontender. Extremities: No lower extremity edema bilaterally, pulses symmetric and intact bilaterally. No cyanosis or clubbing. Skin: Warm, dry and intact. No rashes or erythema. Psychiatric: Normal mood and affect. speech and behavior is normal. Cognition and memory are normal.  Neurological: Speech and facial droop improved. No numbness in fingers.  Lab Results: Basic Metabolic Panel:  Recent Labs Lab 08/22/14 0408 08/23/14 0438  NA 144 142  K 4.1 4.3  CL 108 106  CO2 24 23  GLUCOSE 91 91  BUN 30* 27*  CREATININE 1.55* 1.38*  CALCIUM 8.5 8.8   Liver Function Tests:  Recent Labs Lab 08/21/14 1305    AST 20  ALT 13  ALKPHOS 112  BILITOT 0.4  PROT 6.8  ALBUMIN 3.8   CBC:  Recent Labs Lab 08/21/14 1305  08/22/14 0408 08/23/14 0438  WBC 11.3*  --  7.5 9.2  NEUTROABS 8.0*  --   --   --   HGB 13.3  < > 12.5 13.6  HCT 39.6  < > 37.5 40.5  MCV 90.0  --  90.1 90.2  PLT 342  --  332 292  < > = values in this interval not displayed. Fasting Lipid Panel:  Recent Labs Lab 08/22/14 0408  CHOL 147  HDL 74  LDLCALC 59  TRIG 69  CHOLHDL 2.0   Coagulation:  Recent Labs Lab 08/21/14 1305 08/22/14 0408  LABPROT 14.1 14.6  INR 1.08 1.13   Urine Drug Screen: Drugs of Abuse     Component Value Date/Time   LABOPIA NONE DETECTED 08/21/2014 1354   COCAINSCRNUR NONE DETECTED 08/21/2014 1354   LABBENZ NONE DETECTED 08/21/2014 1354   AMPHETMU NONE DETECTED 08/21/2014 1354   THCU NONE DETECTED 08/21/2014 1354   LABBARB NONE DETECTED 08/21/2014 1354    Alcohol Level:  Recent Labs Lab 08/21/14 1305  ETH <11   Urinalysis:  Recent Labs Lab 08/21/14 1354  COLORURINE YELLOW  LABSPEC 1.008  PHURINE 6.5  GLUCOSEU NEGATIVE  HGBUR MODERATE*  BILIRUBINUR NEGATIVE  KETONESUR NEGATIVE  PROTEINUR NEGATIVE  UROBILINOGEN 0.2  NITRITE NEGATIVE  LEUKOCYTESUR TRACE*   Studies/Results: Ct Head Wo Contrast  08/21/2014   CLINICAL DATA:  80 year old female with  right-sided facial droop and slurred speech  EXAM: CT HEAD WITHOUT CONTRAST  TECHNIQUE: Contiguous axial images were obtained from the base of the skull through the vertex without intravenous contrast.  COMPARISON:  None.  FINDINGS: Negative for acute intracranial hemorrhage, acute infarction, mass, mass effect, hydrocephalus or midline shift. Gray-white differentiation is preserved throughout. Visualized globes are symmetric and unremarkable bilaterally. No focal soft tissue or calvarial abnormality. Mild global cerebral and cerebellar atrophy. Very mild periventricular white matter hypoattenuation consistent was sequelae of  longstanding microvascular ischemic white matter disease. Atherosclerotic calcifications present in both cavernous carotid arteries. Normal aeration of the mastoid air cells and visualized paranasal sinuses.  IMPRESSION: 1. No acute intracranial abnormality 2. Cerebral and cerebellar atrophy 3. Mild chronic microvascular ischemic white matter disease. 4. Intracranial atherosclerosis   Electronically Signed   By: Malachy Moan M.D.   On: 08/21/2014 13:24   Mr Maxine Glenn Head Wo Contrast  08/21/2014   CLINICAL DATA:  Acute embolic stroke.  EXAM: MRA HEAD WITHOUT CONTRAST  TECHNIQUE: Angiographic images of the Circle of Willis were obtained using MRA technique without intravenous contrast.  COMPARISON:  Brain MRI from earlier the same day  FINDINGS: Patient motion results in a significant decrease in diagnostic sensitivity; aneurysms or stenotic lesions could easily be obscured. 3D acquisition was attempted, but of no added benefit.  Strongly left dominant posterior circulation. The vertebrobasilar system is tortuous but not aneurysmal. The PICAs are grossly patent. Superior cerebellar arteries are visible bilaterally. Symmetric appearance of the posterior cerebral arteries which are patent at least the P3 segments.  Balanced anterior circulation. No evidence for major vessel occlusion (with sensitivity significantly decreased beyond the M1 segments) or high-grade central stenosis.  No evidence of aneurysm.  IMPRESSION: 1. Significantly limited study due to patient motion. 2. No evidence of major vessel occlusion or high-grade stenosis.   Electronically Signed   By: Tiburcio Pea M.D.   On: 08/21/2014 18:43   Mr Brain Wo Contrast  08/21/2014   CLINICAL DATA:  Patient awoke with focal right hand numbness. She is head slurred speech during the day. Right-sided facial droop.  EXAM: MRI HEAD WITHOUT CONTRAST  TECHNIQUE: Multiplanar, multiecho pulse sequences of the brain and surrounding structures were obtained  without intravenous contrast.  COMPARISON:  CT head without contrast from the same day.  FINDINGS: A punctate area of cortical restricted diffusion is present in the left frontal operculum. There is cortical and subjacent white matter restricted diffusion more medially in the left frontal operculum as well, evident on image 23 of series 5 and image 18 of series 8. Minimal T2 changes are associated with these areas.  Moderate generalized atrophy and diffuse white matter changes are present bilaterally. Flow is present in the major intracranial arteries. Patient is status post bilateral lens replacements. The paranasal sinuses and mastoid air cells are clear.  IMPRESSION: 1. Acute/subacute focal infarcts involving the left frontal operculum and subcortical white matter as described. 2. Moderate generalized atrophy and diffuse white matter disease likely reflects the sequelae of chronic microvascular ischemia.   Electronically Signed   By: Gennette Pac M.D.   On: 08/21/2014 15:23   Medications:  I have reviewed the patient's current medications. Prior to Admission:  Prescriptions prior to admission  Medication Sig Dispense Refill Last Dose  . amLODipine (NORVASC) 2.5 MG tablet Take 2.5 mg by mouth daily.   08/21/2014 at Unknown time  . aspirin EC 81 MG tablet Take 81 mg by mouth at bedtime.  08/20/2014 at Unknown time  . gabapentin (NEURONTIN) 400 MG capsule Take 400 mg by mouth 3 (three) times daily. Morning, 3pm and bedtime   08/21/2014 at Unknown time  . lidocaine (LIDODERM) 5 % Place 1 patch onto the skin daily. Remove & Discard patch within 12 hours or as directed by MD   08/20/2014 at Unknown time  . loratadine (CLARITIN) 10 MG tablet Take 10 mg by mouth daily.   08/21/2014 at Unknown time  . OVER THE COUNTER MEDICATION Place 1 drop into both eyes at bedtime. Over the counter eye drops for dry eyes   08/20/2014 at Unknown time  . simvastatin (ZOCOR) 20 MG tablet Take 20 mg by mouth at bedtime.     08/20/2014 at Unknown time  . traMADol (ULTRAM) 50 MG tablet Take 50-100 mg by mouth 3 (three) times daily. Take 1 tablet (50 mg) every morning and at bedtime, take 2 tablets (100 mg) daily at 3pm   08/21/2014 at Unknown time   Scheduled Meds: . clopidogrel  75 mg Oral Daily  . gabapentin  400 mg Oral 3 times per day  . heparin subcutaneous  5,000 Units Subcutaneous 3 times per day  . simvastatin  20 mg Oral QHS  . traMADol  50 mg Oral TID   Continuous Infusions:  PRN Meds:.lidocaine, senna-docusate Assessment/Plan: Active Problems:   CVA (cerebral vascular accident)   Essential hypertension   Acute embolic stroke  Acute Left MCA CVA:  Patient had an episode of atrial fibrillation, undiagnosed, picked up on telemetry and on EKG overnight. Patient felt a little dizzy, but this is not uncommon for her. Patient seen by PT who recommends SNF and rolling walker. New diagnosis of paroxysmal atrial fibrillation was discussed with the family and with Neurology. We recommend start Eliquis 2.5 mg BID for anticoagulation. We explained the risks and benefits and patient and family were agreeable. Patient must stay for one more day to qualify for SNF placement with Medicare. [facility]. -Eliquis 2.5 mg BID -Simvastatin 20 mg daily -Telemetry monitoring -Aspiration precautions -Bed rest -PT/OT -Neurology following -[facility]  HTN: Blood pressure 110s-160s/40-80s. Patient is normally on amlodipine 2.5 mg daily at home. We will allow for permissive hypertension for 24-48 hours.  -Allow for permissive hypertension  -Resume amlodipine tomorrow  Post-herpetic Neuralgia: Patient has post-herpetic neuralgia on the right side of her face, right ear and right lateral neck as a result of a shingles outbreak 2 years ago.  -Tramadol 50 mg QAM and QHS -Tramadol 100 mg at lunchtime -Gabapentin 400 mg TID -Lidocaine patch as needed  DVT/PE ppx: Heparin SQ  TID  Dispo: Disposition is deferred at this time, awaiting improvement of current medical problems.  Anticipated discharge in approximately 1-2 day(s).   The patient does have a current PCP Jerl Mina(James Hedrick, MD) and [facility] not [facility] after discharge.  The patient does have transportation limitations that hinder transportation to clinic appointments.  .Services Needed at time of discharge: Y = Yes, Blank = No PT:   OT:   RN:   Equipment:   Other:     LOS: 2 days   Jill AlexandersAlexa Richardson, DO PGY-1 Internal Medicine Resident Pager # [phone] 08/23/2014 11:53 AM

## 2014-08-23 NOTE — Evaluation (Signed)
Occupational Therapy Evaluation Patient Details Name: Samul Dadaancy C Mahn MRN: [id number] DOB: 10/13/1927 Today's Date: 08/23/2014    History of Present Illness Patient is an 80 yo female admitted 08/21/14 with Rt facial droop, speech difficulties, and Rt hand numbness.  MRI showed Lt MCA infarction.  Patient with NIHSS of 3.  PMH:  HTN, HLD, TIA, CKD, arthritis, shingles, post herpetic neuralgia Rt side of neck   Clinical Impression   PT admitted with MRI reveals L MCA infarct. Pt currently with functional limitiations due to the deficits listed below (see OT problem list). PTA [facility] at [facility]. Pt will benefit from skilled OT to increase their independence and safety with adls and balance to allow discharge SNF. Ot to follow acutely for dynamic balance with adls.      Follow Up Recommendations  SNF    Equipment Recommendations  Other (comment) (defer)    Recommendations for Other Services       Precautions / Restrictions Precautions Precautions: Fall      Mobility Bed Mobility Overal bed mobility: Modified Independent                Transfers Overall transfer level: Needs assistance   Transfers: Stand Pivot Transfers Sit to Stand: Min guard         General transfer comment: good hand placement with bil LE braced against bed surface. Pt requires use of bed to help complete transfer    Balance Overall balance assessment: Needs assistance Sitting-balance support: No upper extremity supported;Feet supported Sitting balance-Leahy Scale: Good     Standing balance support: No upper extremity supported;During functional activity Standing balance-Leahy Scale: Fair                              ADL Overall ADL's : Needs assistance/impaired     Grooming: Wash/dry hands;Min Production designer, theatre/television/filmguard;Standing                   Toilet Transfer: Minimal assistance;Ambulation;Regular Teacher, adult educationToilet Toilet Transfer Details (indicate cue type and  reason): checked brief and requesting a second clean brief Toileting- ArchitectClothing Manipulation and Hygiene: Min guard       Functional mobility during ADLs: Minimal assistance General ADL Comments: pt with x1 LOB with transfer from bed to bathroom reaching for door. pt reports "this is not my normal" [facility].      Vision Eye Alignment: Within Functional Limits Alignment/Gaze Preference: Other (comment) (nystagmus noted in L eye) Ocular Range of Motion: Within Functional Limits     Convergence: Impaired (comment)     Additional Comments: pt noted to have end range nystagmus   Perception     Praxis      Pertinent Vitals/Pain Pain Assessment: No/denies pain     Hand Dominance Right   Extremity/Trunk Assessment         Cervical / Trunk Assessment Cervical / Trunk Assessment: Kyphotic Cervical / Trunk Exceptions: hx of cervical pain and deficits   Communication Communication Communication: No difficulties   Cognition Arousal/Alertness: Awake/alert Behavior During Therapy: WFL for tasks assessed/performed Overall Cognitive Status: Within Functional Limits for tasks assessed                     General Comments       Exercises       Shoulder Instructions  Home Living Family/patient expects to be discharged to:: Skilled nursing facility [facility]     Type of Home: Independent living facility                                  Prior Functioning/Environment Level of Independence: Independent        Comments: Daughters provide transportation and assist as needed.    OT Diagnosis: Generalized weakness;Acute pain   OT Problem List: Decreased strength;Decreased activity tolerance;Impaired balance (sitting and/or standing);Impaired vision/perception;Decreased safety awareness;Decreased knowledge of use of DME or AE;Decreased knowledge of precautions;Pain   OT  Treatment/Interventions: Self-care/ADL training;Therapeutic exercise;DME and/or AE instruction;Therapeutic activities;Visual/perceptual remediation/compensation;Patient/family education;Balance training    OT Goals(Current goals can be found in the care plan section) Acute Rehab OT Goals Patient Stated Goal: To be able to return to her apartment OT Goal Formulation: With patient/family Time For Goal Achievement: 09/06/14 Potential to Achieve Goals: Good  OT Frequency: Min 2X/week   Barriers to D/C:            Co-evaluation              End of Session Equipment Utilized During Treatment: Gait belt Nurse Communication: Mobility status;Precautions  Activity Tolerance: Patient tolerated treatment well Patient left: in chair;with chair alarm set;with call bell/phone within reach;with family/visitor present   Time: 4782-95621056-1119 OT Time Calculation (min): 23 min Charges:  OT General Charges $OT Visit: 1 Procedure OT Evaluation $Initial OT Evaluation Tier I: 1 Procedure OT Treatments $Self Care/Home Management : 8-22 mins G-Codes:    Harolyn RutherfordJones, Tamecia Mcdougald B 08/23/2014, 2:54 PM Pager: [phone]

## 2014-08-23 NOTE — Progress Notes (Signed)
Patient is in Atrial Fibrillation, patient is sleeping when RN walked in the room.  Dr Loma NewtonNgo notified.  Patient is asymtommatic and will continue to monitor

## 2014-08-23 NOTE — Progress Notes (Signed)
  Date: 08/23/2014  Patient name: Samul Dadaancy C Watterson  Medical record number: [id number]  Date of birth: 01/22/1927   This patient has been seen and the plan of care was discussed with the house staff. Please see their note for complete details. I concur with their findings with the following additions/corrections:  improving numbness in R hand.  Blood pressure 118/62, pulse 59, temperature 98.8 F (37.1 C), temperature source Oral, resp. rate 18, height 5\' 2"  (1.575 m), weight 55.339 kg (122 lb), SpO2 96 %. nad  Labs- Cr 1.38  TTE- grade 1 diastolic dysfunction Carotid u/s- Findings consistent with 1-39 percent stenosis involving the right internal carotid artery and the left internal carotid artery.  CVA CRI Discussed with neuro, will start her on eliquis.  D/c planning (needs to be in hospital 1 more day for her SNF placement)  Ginnie SmartJeffrey C Yarelli Decelles, MD 08/23/2014, 2:17 PM

## 2014-08-24 DIAGNOSIS — N189 Chronic kidney disease, unspecified: Secondary | ICD-10-CM

## 2014-08-24 MED ORDER — APIXABAN 2.5 MG PO TABS: 2.5000 mg | ORAL_TABLET | Freq: Two times a day (BID) | ORAL | Status: AC

## 2014-08-24 NOTE — Plan of Care (Signed)
Problem: Acute Treatment Outcomes Goal: BP within ordered parameters Outcome: Progressing     

## 2014-08-24 NOTE — Plan of Care (Signed)
Problem: Progression Outcomes Goal: Tolerating diet/TF at goal rate Outcome: Progressing     

## 2014-08-24 NOTE — Plan of Care (Signed)
Problem: Progression Outcomes Goal: If vent dependent, tolerates weaning Outcome: Not Applicable Date Met:  97/41/63

## 2014-08-24 NOTE — Plan of Care (Signed)
Problem: Acute Treatment Outcomes Goal: 02 Sats > 94% Outcome: Completed/Met Date Met:  08/24/14     

## 2014-08-24 NOTE — Progress Notes (Signed)
Patient discharged to facility, pt. Is alert and oriented x4, pt. And family verbalized understanding of discharge instructions, follow up appointments, and discharge medications, IV removed, no signs or symptoms of bleeding or infection, patient returned to facility with all of her belongings Carrie Usery, Tawni PummelWilliam R, RN

## 2014-08-24 NOTE — Discharge Instructions (Signed)
Thank you for allowing [place] to be involved in your healthcare while you were hospitalized at [facility].   Please note that there have been changes to your home medications.  --> PLEASE LOOK AT YOUR DISCHARGE MEDICATION LIST FOR DETAILS.  Please call your PCP if you have any questions or concerns, or any difficulty getting any of your medications.  Please return to the ER if you have worsening of your symptoms or new severe symptoms arise.  Please follow up with Dr. Roda Shutters, Neurologist, on Monday, December 14th at 8:00 am. Please also follow up with your primary care physician, Dr. Burnett Sheng.  Please STOP taking Aspirin and START taking Eliquis 2.5 mg twice a day. Please contact your primary care physician for refills. Please look for signs of blood in your stool, urine, or mouth when you brush. If you have any of these signs or nosebleeds, please contact your doctor right away. You may need to stop Eliquis.  If your symptoms should worsen or if you have a change in symptoms, please return to the ED.  Ischemic Stroke A stroke (cerebrovascular accident) is the sudden death of brain tissue. It is a medical emergency. A stroke can cause permanent loss of brain function. This can cause problems with different parts of your body. A transient ischemic attack (TIA) is different because it does not cause permanent damage. A TIA is a short-lived problem of poor blood flow affecting a part of the brain. A TIA is also a serious problem because having a TIA greatly increases the chances of having a stroke. When symptoms first develop, you cannot know if the problem might be a stroke or a TIA. CAUSES  A stroke is caused by a decrease of oxygen supply to an area of your brain. It is usually the result of a small blood clot or collection of cholesterol or fat (plaque) that blocks blood flow in the brain. A stroke can also be caused by blocked or damaged carotid arteries.  RISK FACTORS  High blood  pressure (hypertension).  High cholesterol.  Diabetes mellitus.  Heart disease.  The buildup of plaque in the blood vessels (peripheral artery disease or atherosclerosis).  The buildup of plaque in the blood vessels providing blood and oxygen to the brain (carotid artery stenosis).  An abnormal heart rhythm (atrial fibrillation).  Obesity.  Smoking.  Taking oral contraceptives (especially in combination with smoking).  Physical inactivity.  A diet high in fats, salt (sodium), and calories.  Alcohol use.  Use of illegal drugs (especially cocaine and methamphetamine).  Being African American.  Being over the age of 34.  Family history of stroke.  Previous history of blood clots, stroke, TIA, or heart attack.  Sickle cell disease. SYMPTOMS  These symptoms usually develop suddenly, or may be newly present upon awakening from sleep:  Sudden weakness or numbness of the face, arm, or leg, especially on one side of the body.  Sudden trouble walking or difficulty moving arms or legs.  Sudden confusion.  Sudden personality changes.  Trouble speaking (aphasia) or understanding.  Difficulty swallowing.  Sudden trouble seeing in one or both eyes.  Double vision.  Dizziness.  Loss of balance or coordination.  Sudden severe headache with no known cause.  Trouble reading or writing. DIAGNOSIS  Your health care provider can often determine the presence or absence of a stroke based on your symptoms, history, and physical exam. Computed tomography (CT) of the brain is usually performed to confirm the  stroke, determine causes, and determine stroke severity. Other tests may be done to find the cause of the stroke. These tests may include:  Electrocardiography.  Continuous heart monitoring.  Echocardiography.  Carotid ultrasonography.  Magnetic resonance imaging (MRI).  A scan of the brain circulation.  Blood tests. PREVENTION  The risk of a stroke can be  decreased by appropriately treating high blood pressure, high cholesterol, diabetes, heart disease, and obesity and by quitting smoking, limiting alcohol, and staying physically active. TREATMENT  Time is of the essence. It is important to seek treatment at the first sign of these symptoms because you may receive a medicine to dissolve the clot (thrombolytic) that cannot be given if too much time has passed since your symptoms began. Even if you do not know when your symptoms began, get treatment as soon as possible as there are other treatment options available including oxygen, intravenous (IV) fluids, and medicines to thin the blood (anticoagulants). Treatment of stroke depends on the duration, severity, and cause of your symptoms. Medicines and dietary changes may be used to address diabetes, high blood pressure, and other risk factors. Physical, speech, and occupational therapists will assess you and work with you to improve any functions impaired by the stroke. Measures will be taken to prevent short-term and long-term complications, including infection from breathing foreign material into the lungs (aspiration pneumonia), blood clots in the legs, bedsores, and falls. Rarely, surgery may be needed to remove large blood clots or to open up blocked arteries. HOME CARE INSTRUCTIONS   Take medicines only as directed by your health care provider. Follow the directions carefully. Medicines may be used to control risk factors for a stroke. Be sure you understand all your medicine instructions.  You may be told to take a medicine to thin the blood, such as aspirin or the anticoagulant warfarin. Warfarin needs to be taken exactly as instructed.  Too much and too little warfarin are both dangerous. Too much warfarin increases the risk of bleeding. Too little warfarin continues to allow the risk for blood clots. While taking warfarin, you will need to have regular blood tests to measure your blood clotting time.  These blood tests usually include both the PT and INR tests. The PT and INR results allow your health care provider to adjust your dose of warfarin. The dose can change for many reasons. It is critically important that you take warfarin exactly as prescribed, and that you have your PT and INR levels drawn exactly as directed.  Many foods, especially foods high in vitamin K, can interfere with warfarin and affect the PT and INR results. Foods high in vitamin K include spinach, kale, broccoli, cabbage, collard and turnip greens, brussels sprouts, peas, cauliflower, seaweed, and parsley, as well as beef and pork liver, green tea, and soybean oil. You should eat a consistent amount of foods high in vitamin K. Avoid major changes in your diet, or notify your health care provider before changing your diet. Arrange a visit with a dietitian to answer your questions.  Many medicines can interfere with warfarin and affect the PT and INR results. You must tell your health care provider about any and all medicines you take. This includes all vitamins and supplements. Be especially cautious with aspirin and anti-inflammatory medicines. Do not take or discontinue any prescribed or over-the-counter medicine except on the advice of your health care provider or pharmacist.  Warfarin can have side effects, such as excessive bruising or bleeding. You will need to hold  pressure over cuts for longer than usual. Your health care provider or pharmacist will discuss other potential side effects.  Avoid sports or activities that may cause injury or bleeding.  Be mindful when shaving, flossing your teeth, or handling sharp objects.  Alcohol can change the body's ability to handle warfarin. It is best to avoid alcoholic drinks or consume only very small amounts while taking warfarin. Notify your health care provider if you change your alcohol intake.  Notify your dentist or other health care providers before procedures.  If  swallow studies have determined that your swallowing reflex is present, you should eat healthy foods. Including 5 or more servings of fruits and vegetables a day may reduce the risk of stroke. Foods may need to be a certain consistency (soft or pureed), or small bites may need to be taken in order to avoid aspirating or choking. Certain dietary changes may be advised to address high blood pressure, high cholesterol, diabetes, or obesity.  Food choices that are low in sodium, saturated fat, trans fat, and cholesterol are recommended to manage high blood pressure.  Food choies that are high in fiber, and low in saturated fat, trans fat, and cholesterol may control cholesterol levels.  Controlling carbohydrates and sugar intake is recommended to manage diabetes.  Reducing calorie intake and making food choices that are low in sodium, saturated fat, trans fat, and cholesterol are recommended to manage obesity.  Maintain a healthy weight.  Stay physically active. It is recommended that you get at least 30 minutes of activity on all or most days.  Do not use any tobacco products including cigarettes, chewing tobacco, or electronic cigarettes.  Limit alcohol use even if you are not taking warfarin. Moderate alcohol use is considered to be:  No more than 2 drinks each day for men.  No more than 1 drink each day for nonpregnant women.  Home safety. A safe home environment is important to reduce the risk of falls. Your health care provider may arrange for specialists to evaluate your home. Having grab bars in the bedroom and bathroom is often important. Your health care provider may arrange for equipment to be used at home, such as raised toilets and a seat for the shower.  Physical, occupational, and speech therapy. Ongoing therapy may be needed to maximize your recovery after a stroke. If you have been advised to use a walker or a cane, use it at all times. Be sure to keep your therapy  appointments.  Follow all instructions for follow-up with your health care provider. This is very important. This includes any referrals, physical therapy, rehabilitation, and lab tests. Proper follow-up can prevent another stroke from occurring. SEEK MEDICAL CARE IF:  You have personality changes.  You have difficulty swallowing.  You are seeing double.  You have dizziness.  You have a fever.  You have skin breakdown. SEEK IMMEDIATE MEDICAL CARE IF:  Any of these symptoms may represent a serious problem that is an emergency. Do not wait to see if the symptoms will go away. Get medical help right away. Call your local emergency services (911 in [place] Do not drive yourself to the hospital.  You have sudden weakness or numbness of the face, arm, or leg, especially on one side of the body.  You have sudden trouble walking or difficulty moving arms or legs.  You have sudden confusion.  You have trouble speaking (aphasia) or understanding.  You have sudden trouble seeing in one or both eyes.  You have a loss of balance or coordination.  You have a sudden, severe headache with no known cause.  You have new chest pain or an irregular heartbeat.  You have a partial or total loss of consciousness. Document Released: 09/30/2005 Document Revised: 02/14/2014 Document Reviewed: 05/10/2012 [facility] Patient Information 2015 [place], [place]. This information is not intended to replace advice given to you by your health care provider. Make sure you discuss any questions you have with your health care provider.

## 2014-08-24 NOTE — Progress Notes (Signed)
Subjective:  Patient was seen and examined this morning. Patient did well overnight, no complaints of dizziness of bleeding. Patient states her numbness has resolved. Her speech has improved, but she continues to have pauses when she has to think of a word.   Objective: Vital signs in last 24 hours: Filed Vitals:   08/23/14 1755 08/23/14 2044 08/24/14 0225 08/24/14 0617  BP: 101/53 103/54 116/58 143/64  Pulse: 65 58 57 63  Temp: 97.8 F (36.6 C) 98 F (36.7 C) 97.8 F (36.6 C) 97.5 F (36.4 C)  TempSrc: Oral Oral Oral Oral  Resp: 18 16 16 16   Height:      Weight:      SpO2: 95% 94% 94% 94%   General: Vital signs reviewed. Patient is well-developed and well-nourished, in no acute distress and cooperative with exam.  Eyes: EOMI, conjunctivae normal, no scleral icterus.  Neck: Chronic flexion. No carotid bruit present.  Cardiovascular: RRR. 3/6 systolic ejection murmur, S1 normal, S2 normal, no gallops, or rubs. Pulmonary/Chest: Clear to auscultation bilaterally, no wheezes, rales, or rhonchi. Abdominal: Soft, non-tender, non-distended, BS +, no masses, organomegaly, or guarding present.  Musculoskeletal: No joint deformities, erythema, or stiffness, ROM full and nontender. Extremities: No lower extremity edema bilaterally, pulses symmetric and intact bilaterally. No cyanosis or clubbing. Skin: Warm, dry and intact. No rashes or erythema. Psychiatric: Normal mood and affect. speech and behavior is normal. Cognition and memory are normal.  Neurological: Speech and facial droop improved. No numbness in fingers.  Lab Results: Basic Metabolic Panel:  Recent Labs Lab 08/22/14 0408 08/23/14 0438  NA 144 142  K 4.1 4.3  CL 108 106  CO2 24 23  GLUCOSE 91 91  BUN 30* 27*  CREATININE 1.55* 1.38*  CALCIUM 8.5 8.8   Liver Function Tests:  Recent Labs Lab 08/21/14 1305  AST 20  ALT 13  ALKPHOS 112  BILITOT 0.4  PROT 6.8  ALBUMIN 3.8   CBC:  Recent Labs Lab  08/21/14 1305  08/22/14 0408 08/23/14 0438  WBC 11.3*  --  7.5 9.2  NEUTROABS 8.0*  --   --   --   HGB 13.3  < > 12.5 13.6  HCT 39.6  < > 37.5 40.5  MCV 90.0  --  90.1 90.2  PLT 342  --  332 292  < > = values in this interval not displayed. Fasting Lipid Panel:  Recent Labs Lab 08/22/14 0408  CHOL 147  HDL 74  LDLCALC 59  TRIG 69  CHOLHDL 2.0   Coagulation:  Recent Labs Lab 08/21/14 1305 08/22/14 0408  LABPROT 14.1 14.6  INR 1.08 1.13   Urine Drug Screen: Drugs of Abuse     Component Value Date/Time   LABOPIA NONE DETECTED 08/21/2014 1354   COCAINSCRNUR NONE DETECTED 08/21/2014 1354   LABBENZ NONE DETECTED 08/21/2014 1354   AMPHETMU NONE DETECTED 08/21/2014 1354   THCU NONE DETECTED 08/21/2014 1354   LABBARB NONE DETECTED 08/21/2014 1354    Alcohol Level:  Recent Labs Lab 08/21/14 1305  ETH <11   Urinalysis:  Recent Labs Lab 08/21/14 1354  COLORURINE YELLOW  LABSPEC 1.008  PHURINE 6.5  GLUCOSEU NEGATIVE  HGBUR MODERATE*  BILIRUBINUR NEGATIVE  KETONESUR NEGATIVE  PROTEINUR NEGATIVE  UROBILINOGEN 0.2  NITRITE NEGATIVE  LEUKOCYTESUR TRACE*   Studies/Results: No results found. Medications:  I have reviewed the patient's current medications. Prior to Admission:  Prescriptions prior to admission  Medication Sig Dispense Refill Last Dose  . amLODipine (  NORVASC) 2.5 MG tablet Take 2.5 mg by mouth daily.   08/21/2014 at Unknown time  . aspirin EC 81 MG tablet Take 81 mg by mouth at bedtime.   08/20/2014 at Unknown time  . gabapentin (NEURONTIN) 400 MG capsule Take 400 mg by mouth 3 (three) times daily. Morning, 3pm and bedtime   08/21/2014 at Unknown time  . lidocaine (LIDODERM) 5 % Place 1 patch onto the skin daily. Remove & Discard patch within 12 hours or as directed by MD   08/20/2014 at Unknown time  . loratadine (CLARITIN) 10 MG tablet Take 10 mg by mouth daily.   08/21/2014 at Unknown time  . OVER THE COUNTER MEDICATION Place 1 drop into both  eyes at bedtime. Over the counter eye drops for dry eyes   08/20/2014 at Unknown time  . simvastatin (ZOCOR) 20 MG tablet Take 20 mg by mouth at bedtime.    08/20/2014 at Unknown time  . traMADol (ULTRAM) 50 MG tablet Take 50-100 mg by mouth 3 (three) times daily. Take 1 tablet (50 mg) every morning and at bedtime, take 2 tablets (100 mg) daily at 3pm   08/21/2014 at Unknown time   Scheduled Meds: . amLODipine  2.5 mg Oral Daily  . apixaban  2.5 mg Oral BID  . gabapentin  400 mg Oral 3 times per day  . simvastatin  20 mg Oral QHS  . traMADol  50 mg Oral TID   Continuous Infusions:  PRN Meds:.lidocaine, senna-docusate Assessment/Plan: Principal Problem:   CVA (cerebral vascular accident) Active Problems:   Essential hypertension   Postherpetic neuralgia   HLD (hyperlipidemia)   Paroxysmal atrial fibrillation  Acute Left MCA CVA:  No more episodes of atrial fibrillation detected. Patient is tolerating Eliquis well. Patient educated on risks of Eliquis and what symptoms to be aware of. Dysarthria and numbness resolved. Discharge to SNF today. -Eliquis 2.5 mg BID -Simvastatin 20 mg daily -Telemetry monitoring -Aspiration precautions -Bed rest -PT/OT -Neurology following -Discharge to [facility] today  HTN: Blood pressure 110s-160s/40-80s. Patient is normally on amlodipine 2.5 mg daily at home. We will allow for permissive hypertension for 24-48 hours.  -Allow for permissive hypertension  -Resume amlodipine on discharge  Post-herpetic Neuralgia: Patient has post-herpetic neuralgia on the right side of her face, right ear and right lateral neck as a result of a shingles outbreak 2 years ago.  -Tramadol 50 mg QAM and QHS -Tramadol 100 mg at lunchtime -Gabapentin 400 mg TID -Lidocaine patch as needed  DVT/PE ppx: Heparin SQ TID  Dispo: Disposition is deferred at this time, awaiting improvement of current medical problems.  Anticipated discharge in approximately 1-2 day(s).    The patient does have a current PCP Jerl Mina(James Hedrick, MD) and does not [facility] after discharge.  The patient does have transportation limitations that hinder transportation to clinic appointments.  .Services Needed at time of discharge: Y = Yes, Blank = No PT:   OT:   RN:   Equipment:   Other:     LOS: 3 days   Jill AlexandersAlexa Richardson, DO PGY-1 Internal Medicine Resident Pager # [phone] 08/24/2014 6:52 AM

## 2014-08-24 NOTE — Clinical Social Work Note (Signed)
CSW met pt and the pt's daughter at bedside. CSW introduced self and purpose of visit. CSW informed the pt that she will be discharge back to [facility] today. CSW and pt discussed transport. Pt's daughter reported that she will transport the pt. CSW upload the pt's discharge summary to [facility]. Bedside RN can call reported to [phone].   [place], MSW, [place]

## 2014-08-24 NOTE — Progress Notes (Signed)
Physical Therapy Treatment Patient Details Name: Kristina Aguirre MRN: [id number] DOB: 08/19/1927 Today's Date: 08/24/2014    History of Present Illness Patient is an 80 yo female admitted 08/21/14 with Rt facial droop, speech difficulties, and Rt hand numbness.  MRI showed Lt MCA infarction.  Patient with NIHSS of 3.  PMH:  HTN, HLD, TIA, CKD, arthritis, shingles, post herpetic neuralgia Rt side of neck    PT Comments    Patient progressing well with ambulation this session and able to complete DGI balance screening. Patient scored 8/24 on the DGI indicating high fall risk. Patient is planning [facility] prior to returning to [facility] Living. Continue with current POC  Follow Up Recommendations  SNF;Supervision/Assistance - 24 hour     Equipment Recommendations  Rolling walker with 5" wheels    Recommendations for Other Services       Precautions / Restrictions Precautions Precautions: Fall Restrictions Weight Bearing Restrictions: No    Mobility  Bed Mobility Overal bed mobility: Modified Independent                Transfers Overall transfer level: Needs assistance Equipment used: None   Sit to Stand: Min guard         General transfer comment: good hand placement with bil LE braced against bed surface. Pt requires use of bed to help complete transfer. Worked on NOT bracing LEs with stand  Ambulation/Gait Ambulation/Gait assistance: Min guard Ambulation Distance (Feet): 200 Feet Assistive device: None Gait Pattern/deviations: Step-through pattern;Decreased stride length Gait velocity: Decreased   General Gait Details: Patient with unsteady gait not no LOB that she was unable to correct herself. Continues to stagger some with gait. Attempted use of RW but appeared to be more in the way for patient then a help. Patient normally wears a neck brace when she ambulates but the brace is at home. Patient stated that pain increased with  ambulation   Stairs Stairs:  (see DGI)          Wheelchair Mobility    Modified Rankin (Stroke Patients Only) Modified Rankin (Stroke Patients Only) Pre-Morbid Rankin Score: No symptoms Modified Rankin: Moderately severe disability     Balance     Sitting balance-Leahy Scale: Good       Standing balance-Leahy Scale: Good                      Cognition Arousal/Alertness: Awake/alert Behavior During Therapy: WFL for tasks assessed/performed Overall Cognitive Status: Within Functional Limits for tasks assessed                      Exercises      General Comments        Pertinent Vitals/Pain Pain Score: 10-Worst pain ever Pain Location: R side neck Pain Intervention(s): Repositioned;Patient requesting pain meds-RN notified;Heat applied    Home Living                      Prior Function            PT Goals (current goals can now be found in the care plan section) Progress towards PT goals: Progressing toward goals    Frequency  Min 3X/week    PT Plan Current plan remains appropriate    Co-evaluation             End of Session Equipment Utilized During Treatment: Gait belt Activity Tolerance: Patient tolerated treatment well Patient left: in  chair;with call bell/phone within reach;with family/visitor present;with chair alarm set     Time: [phone] PT Time Calculation (min) (ACUTE ONLY): 24 min  Charges:  $Gait Training: 8-22 mins $Therapeutic Activity: 8-22 mins                    G Codes:      Fredrich BirksRobinette, Julia Elizabeth 08/24/2014, 8:34 AM 08/24/2014 Fredrich Birksobinette, Julia Elizabeth PTA [phone] pager [phone] office

## 2014-08-24 NOTE — Plan of Care (Signed)
Problem: Discharge/Transitional Outcomes Goal: Barriers To Progression Addressed/Resolved Outcome: Completed/Met Date Met:  08/24/14 Goal: Educational Plan Complete Outcome: Completed/Met Date Met:  08/24/14 Goal: Hemodynamically stable Outcome: Completed/Met Date Met:  08/24/14 Goal: If vent dependent, trach in place Outcome: Completed/Met Date Met:  08/24/14 Goal: Independent mobility/functioning independent or with min Independent mobility/functioning independently or with minimal assistance  Outcome: Completed/Met Date Met:  08/24/14 Goal: Tolerating diet/TF at goal rate-PEG if inadequate intake Outcome: Completed/Met Date Met:  08/24/14 Goal: INR monitor plan established Outcome: Not Applicable Date Met:  41/58/30 Goal: Family and patient agree upon discharge plan Outcome: Completed/Met Date Met:  08/24/14 Goal: Family/Caregiver willing and able to support plan Family/Caregiver willing and able to support plan for self-management after transition home  Outcome: Completed/Met Date Met:  08/24/14 Goal: PCP appointment made and transportation plan in place Outcome: Completed/Met Date Met:  08/24/14 Goal: Ability to attain medications upon leaving hospital Outcome: Not Applicable Date Met:  94/07/68 Goal: Other Discharge Outcomes/Goals Outcome: Not Applicable Date Met:  08/81/10

## 2014-08-24 NOTE — Plan of Care (Signed)
Problem: Progression Outcomes Goal: Communication method established Outcome: Completed/Met Date Met:  08/24/14

## 2014-08-24 NOTE — Progress Notes (Signed)
  Date: 08/24/2014  Patient name: Samul Dadaancy C Martes  Medical record number: [id number]  Date of birth: 11/15/1926   This patient has been seen and the plan of care was discussed with the house staff. Please see their note for complete details. I concur with their findings with the following additions/corrections:  Pt without complaints, comfortable, ready for d/c.   Blood pressure 103/54, pulse 65, temperature 97.8 F (36.6 C), temperature source Oral, resp. rate 20, height 5\' 2"  (1.575 m), weight 55.339 kg (122 lb), SpO2 98 %. cv- rrr Chest- cta abd- bs+, soft, non-tender.   Lab- no new  A/p CVA Paroxysmal A fibb  She is to be d/c to SNF today.  Will cont eliquis, given instructions about bleeding precautions (also discussed with daughter).   Ginnie SmartJeffrey C Hatcher, MD 08/24/2014, 10:18 AM

## 2014-08-24 NOTE — Plan of Care (Signed)
Problem: Acute Treatment Outcomes Goal: tPA Patient w/o S&S of bleeding Outcome: Not Applicable Date Met:  15/17/61

## 2014-08-25 DIAGNOSIS — I480 Paroxysmal atrial fibrillation: Secondary | ICD-10-CM

## 2014-08-25 DIAGNOSIS — E785 Hyperlipidemia, unspecified: Secondary | ICD-10-CM

## 2014-08-25 DIAGNOSIS — I10 Essential (primary) hypertension: Secondary | ICD-10-CM

## 2014-08-25 DIAGNOSIS — I639 Cerebral infarction, unspecified: Secondary | ICD-10-CM

## 2014-08-25 DIAGNOSIS — B0229 Other postherpetic nervous system involvement: Secondary | ICD-10-CM

## 2014-09-26 ENCOUNTER — Ambulatory Visit: Payer: Self-pay | Admitting: Neurology

## 2014-09-28 ENCOUNTER — Other Ambulatory Visit: Payer: Self-pay | Admitting: Internal Medicine

## 2014-10-05 ENCOUNTER — Ambulatory Visit: Payer: Self-pay | Admitting: Pain Medicine

## 2014-11-08 ENCOUNTER — Ambulatory Visit (INDEPENDENT_AMBULATORY_CARE_PROVIDER_SITE_OTHER): Payer: Medicare Other | Admitting: Neurology

## 2014-11-08 ENCOUNTER — Encounter: Payer: Self-pay | Admitting: Neurology

## 2014-11-08 VITALS — BP 153/74 | HR 67 | Ht 62.0 in | Wt 128.2 lb

## 2014-11-08 DIAGNOSIS — I639 Cerebral infarction, unspecified: Secondary | ICD-10-CM

## 2014-11-08 DIAGNOSIS — E785 Hyperlipidemia, unspecified: Secondary | ICD-10-CM

## 2014-11-08 DIAGNOSIS — I6340 Cerebral infarction due to embolism of unspecified cerebral artery: Secondary | ICD-10-CM

## 2014-11-08 DIAGNOSIS — I480 Paroxysmal atrial fibrillation: Secondary | ICD-10-CM

## 2014-11-08 DIAGNOSIS — I10 Essential (primary) hypertension: Secondary | ICD-10-CM

## 2014-11-08 NOTE — Patient Instructions (Addendum)
-   continue eliquis 2.5mg  twice a day for stroke prevention - continue zocor for stroke prevention - check BP at home - follow up with pain management for neuralgia, may consider radioablation if medication not effective. - Follow up with your primary care physician for stroke risk factor modification. Recommend maintain blood pressure goal <130/80, diabetes with hemoglobin A1c goal below 6.5% and lipids with LDL cholesterol goal below 70 mg/dL.  - follow up in 6 months.

## 2014-11-08 NOTE — Progress Notes (Signed)
STROKE NEUROLOGY FOLLOW UP NOTE  NAME: Kristina Aguirre DOB: 07-15-1927  REASON FOR VISIT: stroke follow up HISTORY FROM: chart and daughter  Today we had the pleasure of seeing Kristina Aguirre in follow-up at our Neurology Clinic. Pt was accompanied by daughter.   History Summary Kristina Aguirre is an 81 y.o. female history of hypertension, hypercholesterolemia, TIA and arthritis, was admitted in 08/2014 for new onset right facial droop and right hand numbness. She's been taking aspirin 81 mg per day. CT scan of the head showed no acute normality. MRI showed acute focal infarcts involving left frontal operculum cortical and subcortical white matter. She was found to have afib and was put on eliquis 2.5mg  bid. She was discharged to SNF with eliquis and zocor.   Interval History During the interval time, the patient has been doing well with stroke. She is living at independent living now with no significant stroke deficit. She is still on eliquis and zocor. However, she has long standing post herpetic neuralgia, currently following up with pain management. She is on lidocaine patch and fentanyl patch with neurontin. She felt some what better than before. Her BP was 153/74 today in clinic and she did not check BP at home.   REVIEW OF SYSTEMS: Full 14 system review of systems performed and notable only for those listed below and in HPI above, all others are negative:  Constitutional: N/A  Cardiovascular: N/A  Ear/Nose/Throat: N/A  Skin: N/A  Eyes: N/A  Respiratory: N/A  Gastroitestinal: N/A  Genitourinary: N/A Hematology/Lymphatic: N/A  Endocrine: N/A  Musculoskeletal: N/A  Allergy/Immunology: N/A  Neurological: N/A  Psychiatric: N/A  The following represents the patient's updated allergies and side effects list: Allergies  Allergen Reactions  . Penicillins Hives    The neurologically relevant items on the patient's problem list were reviewed on today's visit.  Neurologic  Examination  A problem focused neurological exam (12 or more points of the single system neurologic examination, vital signs counts as 1 point, cranial nerves count for 8 points) was performed.  Blood pressure 153/74, pulse 67, height  (1.575 m), weight 128 lb 3.2 oz (58.151 kg).  General - Well nourished, well developed, in no apparent distress.  Ophthalmologic - not able to see well due to neck anteropulsion.  Cardiovascular - Regular rate and rhythm with no murmur, no afib.  Mental Status -  Level of arousal and orientation to time, place, and person were intact. Language including expression, naming, repetition, comprehension was assessed and found intact.  Cranial Nerves II - XII - II - Visual field intact OU. III, IV, VI - Extraocular movements intact. V - Facial sensation intact bilaterally. VII - Facial movement intact bilaterally. VIII - Hearing & vestibular intact bilaterally. X - Palate elevates symmetrically. XI - Chin turning & shoulder shrug intact bilaterally. XII - Tongue protrusion intact.  Motor Strength - The patient's strength was 5-/5 in all extremities and pronator drift was absent.  Bulk was normal and fasciculations were absent.   Motor Tone - Muscle tone was assessed at the neck and appendages and was normal.  Reflexes - The patient's reflexes were normal in all extremities and she had no pathological reflexes.  Sensory - Light touch, temperature/pinprick were assessed and were normal.    Coordination - The patient had normal movements in the hands with no ataxia or dysmetria.  Tremor was absent.  Gait and Station - walking with the cane and steady but stooped posturing with neck  anteropulsion.  Data reviewed: I personally reviewed the images and agree with the radiology interpretations.  Ct Head Wo Contrast  08/21/2014 IMPRESSION: 1. No acute intracranial abnormality 2. Cerebral and cerebellar atrophy 3. Mild chronic microvascular ischemic  white matter disease. 4. Intracranial atherosclerosis   Mra Head Wo Contrast  08/21/2014 IMPRESSION: 1. Significantly limited study due to patient motion. 2. No evidence of major vessel occlusion or high-grade stenosis.   Mr Brain Wo Contrast  08/21/2014 IMPRESSION: 1. Acute/subacute focal infarcts involving the left frontal operculum and subcortical white matter as described. 2. Moderate generalized atrophy and diffuse white matter disease likely reflects the sequelae of chronic microvascular ischemia.   Carotid Doppler There is 1-39% bilateral ICA stenosis. Vertebral artery flow is antegrade  2D echo -  - Left ventricle: The cavity size was normal. Systolic function was normal. The estimated ejection fraction was in the range of 55% to 60%. Wall motion was normal; there were no regional wall motion abnormalities. Doppler parameters are consistent with abnormal left ventricular relaxation (grade 1 diastolic dysfunction). - Aortic valve: There was mild regurgitation. - Mitral valve: There was mild to moderate regurgitation. - Left atrium: The atrium was mildly dilated. - Right ventricle: The cavity size was mildly dilated. Wall thickness was normal. - Right atrium: The atrium was moderately dilated. - Tricuspid valve: There was moderate regurgitation. - Pulmonary arteries: Systolic pressure was moderately increased.  Impressions: - No cardiac source of emboli was indentified.  Component     Latest Ref Rng 08/22/2014  Cholesterol     0 - 200 mg/dL 644147  Triglycerides     <150 mg/dL 69  HDL     >03>39 mg/dL 74  Total CHOL/HDL Ratio      2.0  VLDL     0 - 40 mg/dL 14  LDL (calc)     0 - 99 mg/dL 59  Hgb [id number] MFr Bld     <5.7 % 5.9 (H)  Mean Plasma Glucose     <117 mg/dL 259123 (H)    Assessment: As you may recall, she is a 81 y.o. Caucasian female with PMH of hypertension, hypercholesterolemia, TIA and arthritis was admitted in 08/2014 for left frontal  operculum cortical and subcortical white matter acute infarct, consistent with embolic pattern. She was found to have afib and was put on eliquis 2.5mg  bid. She was doing well during interval time but still deal with post herpetic neuralgia currently.   Plan:  - continue eliquis 2.5mg  bid and zocor for stroke prevention - will not change her eliquis dose as age > 80 and weight < 60kg - check BP at home, [facility] - follow up with pain management for neuralgia, may consider radioablation if medication not effective - Follow up with your primary care physician for stroke risk factor modification. Recommend maintain blood pressure goal <130/80, diabetes with hemoglobin A1c goal below 6.5% and lipids with LDL cholesterol goal below 70 mg/dL.  - RTC in 6 months.  No orders of the defined types were placed in this encounter.    Meds ordered this encounter  Medications  . fentaNYL (DURAGESIC - DOSED MCG/HR) 25 MCG/HR patch    Sig: Place 1 patch onto the skin every 3 (three) days.    Refill:  0    Patient Instructions  - continue eliquis 2.5mg  twice a day for stroke prevention - continue zocor for stroke prevention - check BP at home - follow up with pain management  for neuralgia, may consider radioablation if medication not effective. - Follow up with your primary care physician for stroke risk factor modification. Recommend maintain blood pressure goal <130/80, diabetes with hemoglobin A1c goal below 6.5% and lipids with LDL cholesterol goal below 70 mg/dL.  - follow up in 6 months.   Marvel Plan, MD PhD [facility] [address] [place], [place] [address] [phone]

## 2015-01-24 ENCOUNTER — Ambulatory Visit: Payer: Medicare Other | Admitting: Internal Medicine

## 2015-05-09 ENCOUNTER — Ambulatory Visit: Payer: Medicare Other | Admitting: Neurology

## 2016-01-03 DIAGNOSIS — F0151 Vascular dementia with behavioral disturbance: Secondary | ICD-10-CM | POA: Insufficient documentation

## 2016-01-03 DIAGNOSIS — F01518 Vascular dementia, unspecified severity, with other behavioral disturbance: Secondary | ICD-10-CM | POA: Insufficient documentation

## 2016-01-30 ENCOUNTER — Ambulatory Visit: Payer: Medicare Other | Admitting: Podiatry

## 2016-02-22 ENCOUNTER — Ambulatory Visit: Payer: Medicare Other | Admitting: Podiatry

## 2016-02-27 ENCOUNTER — Encounter: Payer: Self-pay | Admitting: Podiatry

## 2016-02-27 ENCOUNTER — Ambulatory Visit (INDEPENDENT_AMBULATORY_CARE_PROVIDER_SITE_OTHER): Payer: Medicare Other | Admitting: Podiatry

## 2016-02-27 VITALS — BP 148/77 | HR 61 | Resp 18

## 2016-02-27 DIAGNOSIS — B351 Tinea unguium: Secondary | ICD-10-CM

## 2016-02-27 DIAGNOSIS — M79674 Pain in right toe(s): Secondary | ICD-10-CM

## 2016-02-27 DIAGNOSIS — M79675 Pain in left toe(s): Secondary | ICD-10-CM | POA: Diagnosis not present

## 2016-02-27 NOTE — Progress Notes (Signed)
   Subjective:    Patient ID: Samul Dadaancy C Baltzell, female    DOB: 05/07/1927, 82 y.o.   MRN: [id number]  HPI  82 year old female presents the office today for concerns of thick, painful, elongated toenails to all of her toes particular her big toenails. The areas painful pressure in shoes. Denies any redness or drainage. No swelling. No other complaints. Review of Systems  All other systems reviewed and are negative.      Objective:   Physical Exam General: AAO x3, NAD  Dermatological: Nails are hypertrophic, dystrophic, brittle, discolored, elongated 10. Particular bilateral hallux nails are significantly hypertrophic. Upon debridement of the right hallux toenail small amount of purulence is expressed and nail. There is no further purulence upon debridement. No edema, erythema around the toenails. Tenderness nails 1-5 bilaterally.  Vascular: Dorsalis Pedis artery and Posterior Tibial artery pedal pulses are 2/4 bilateral with immedate capillary fill time. Pedal hair growth present. No varicosities and no lower extremity edema present bilateral. There is no pain with calf compression, swelling, warmth, erythema.   Neruologic: Grossly intact via light touch bilateral. Vibratory intact via tuning fork bilateral. Protective threshold with Semmes Wienstein monofilament intact to all pedal sites bilateral. Patellar and Achilles deep tendon reflexes 2+ bilateral. No Babinski or clonus noted bilateral.   Musculoskeletal: No gross boney pedal deformities bilateral. No pain, crepitus, or limitation noted with foot and ankle range of motion bilateral. Muscular strength 5/5 in all groups tested bilateral.  Gait: Unassisted, Nonantalgic.      Assessment & Plan:  82 year old female with symptomatic onychomycosis, right hallux localized abscess -Treatment options discussed including all alternatives, risks, and complications -Etiology of symptoms were discussed -Daily foot inspection.  -Nails 10  were  sharply debrided. Upon debridement of right hallux toenail there was localized pus. No further drainage after debridement. Epsom salt soaks. Antibiotic ointment and a bandage. The signs or symptoms of infection to call the office and medially. Otherwise I'll see her back in 3 months. Call any questions or concerns in the meantime.   Ovid CurdMatthew Wagoner, DPM

## 2016-02-27 NOTE — Patient Instructions (Signed)
Monitor for any signs/symptoms of infection. Call the office immediately if any occur or go directly to the emergency room. Call with any questions/concerns.  

## 2016-03-05 ENCOUNTER — Emergency Department: Payer: Medicare Other

## 2016-03-05 ENCOUNTER — Emergency Department
Admission: EM | Admit: 2016-03-05 | Discharge: 2016-03-06 | Disposition: A | Discharge status: Home / Self Care | Payer: Medicare Other | Attending: Emergency Medicine | Admitting: Emergency Medicine

## 2016-03-05 DIAGNOSIS — Y9289 Other specified places as the place of occurrence of the external cause: Secondary | ICD-10-CM | POA: Diagnosis not present

## 2016-03-05 DIAGNOSIS — Z791 Long term (current) use of non-steroidal anti-inflammatories (NSAID): Secondary | ICD-10-CM | POA: Diagnosis not present

## 2016-03-05 DIAGNOSIS — M25511 Pain in right shoulder: Secondary | ICD-10-CM | POA: Diagnosis present

## 2016-03-05 DIAGNOSIS — I10 Essential (primary) hypertension: Secondary | ICD-10-CM | POA: Diagnosis not present

## 2016-03-05 DIAGNOSIS — Z79899 Other long term (current) drug therapy: Secondary | ICD-10-CM | POA: Diagnosis not present

## 2016-03-05 DIAGNOSIS — W1800XA Striking against unspecified object with subsequent fall, initial encounter: Secondary | ICD-10-CM | POA: Diagnosis not present

## 2016-03-05 DIAGNOSIS — Y999 Unspecified external cause status: Secondary | ICD-10-CM | POA: Diagnosis not present

## 2016-03-05 DIAGNOSIS — T797XXA Traumatic subcutaneous emphysema, initial encounter: Secondary | ICD-10-CM

## 2016-03-05 DIAGNOSIS — Y939 Activity, unspecified: Secondary | ICD-10-CM | POA: Diagnosis not present

## 2016-03-05 DIAGNOSIS — E785 Hyperlipidemia, unspecified: Secondary | ICD-10-CM | POA: Diagnosis not present

## 2016-03-05 DIAGNOSIS — M1990 Unspecified osteoarthritis, unspecified site: Secondary | ICD-10-CM | POA: Diagnosis not present

## 2016-03-05 DIAGNOSIS — Z8673 Personal history of transient ischemic attack (TIA), and cerebral infarction without residual deficits: Secondary | ICD-10-CM | POA: Insufficient documentation

## 2016-03-05 DIAGNOSIS — S2241XA Multiple fractures of ribs, right side, initial encounter for closed fracture: Secondary | ICD-10-CM

## 2016-03-05 MED ORDER — OXYCODONE-ACETAMINOPHEN 5-325 MG PO TABS
1.0000 | ORAL_TABLET | Freq: Once | ORAL | Status: AC
  Administered 2016-03-05: 1 via ORAL
  Filled 2016-03-05: qty 1

## 2016-03-05 NOTE — ED Notes (Signed)
Pt lives in independent living at [facility] and fell on Sunday up against a wall.  Patient did not tell anyone and was not evaluated at that time.  Tonight patient was getting dressed and could not get arm into R sleeve.  When staff came in to help, noticed pt's R shoulder has bump to it.  Pt c/o pain to R arm.  Pt pleasantly confused at baseline.

## 2016-03-05 NOTE — ED Provider Notes (Signed)
[facility] Emergency Department Provider Note  ____________________________________________  Time seen: Approximately 11:24 PM  I have reviewed the triage vital signs and the nursing notes.   HISTORY  Chief Complaint Arm Injury    HPI Kristina Aguirre is a 82 y.o. female with a history of HTN, HL and TIA presenting with right shoulder pain after fall. The patient reports that on Sunday, 3 days ago, she lost her balance and fell with her right shoulder into the wall. She had some immediate pain but over the next couple of days she has developed some swelling and decreased range of motion in that arm. She denies any syncope, chest pain, shortness of breath, loss of consciousness. She has had no neck or back pain. No numbness tingling or weakness.   Past Medical History  Diagnosis Date  . Hypertension   . High cholesterol   . TIA (transient ischemic attack)   . Renal disorder   . Hematuria   . Arthritis   . Post herpetic neuralgia     Right side of neck  . Shingles     Patient Active Problem List   Diagnosis Date Noted  . CVA (cerebrovascular accident) (HCC) 11/08/2014  . Acute embolic stroke (HCC) 11/08/2014  . Paroxysmal atrial fibrillation (HCC) 08/23/2014  . CVA (cerebral vascular accident) (HCC) 08/21/2014  . Essential hypertension 08/21/2014  . Postherpetic neuralgia 08/21/2014  . HLD (hyperlipidemia) 08/21/2014    Past Surgical History  Procedure Laterality Date  . Tubal ligation      Current Outpatient Rx  Name  Route  Sig  Dispense  Refill  . amLODipine (NORVASC) 2.5 MG tablet   Oral   Take 2.5 mg by mouth daily.         Marland Kitchen. apixaban (ELIQUIS) 2.5 MG TABS tablet   Oral   Take 1 tablet (2.5 mg total) by mouth 2 (two) times daily.   60 tablet   0   . gabapentin (NEURONTIN) 100 MG capsule   Oral   Take 200-300 mg by mouth 2 (two) times daily. 300mg  in the morning and 200mg  at bedtime         . loratadine (CLARITIN) 10 MG  tablet   Oral   Take 10 mg by mouth daily.         Marland Kitchen. OVER THE COUNTER MEDICATION   Both Eyes   Place 1 drop into both eyes at bedtime. Over the counter eye drops for dry eyes         . simvastatin (ZOCOR) 20 MG tablet   Oral   Take 20 mg by mouth at bedtime.          . traMADol (ULTRAM) 50 MG tablet   Oral   Take 50-100 mg by mouth 3 (three) times daily. Take 1 tablet (50 mg) every morning and at bedtime, take 2 tablets (100 mg) daily at 3pm         . oxyCODONE-acetaminophen (PERCOCET/ROXICET) 5-325 MG tablet   Oral   Take 1 tablet by mouth every 6 (six) hours as needed for severe pain.   15 tablet   0     Allergies Penicillins  Family History  Problem Relation Age of Onset  . Adopted: Yes    Social History Social History  Substance Use Topics  . Smoking status: Never Smoker   . Smokeless tobacco: Never Used  . Alcohol Use: 0.6 oz/week    1 Glasses of wine per week  Comment: "I like a glass of wine occasionally"    Review of Systems Constitutional: No fever/chills.No lightheadedness or syncope. Eyes: No visual changes. No blurred or double vision. ENT: No sore throat. No congestion or rhinorrhea. Cardiovascular: Denies chest pain. Denies palpitations. Respiratory: Denies shortness of breath.  No cough. Gastrointestinal: No abdominal pain.  No nausea, no vomiting.  No diarrhea.  No constipation. Musculoskeletal: Negative for back pain. Positive right shoulder pain. Skin: Negative for rash. Neurological: Negative for headaches. No focal numbness, tingling or weakness.   10-point ROS otherwise negative.  ____________________________________________   PHYSICAL EXAM:  VITAL SIGNS: ED Triage Vitals  Enc Vitals Group     BP --      Pulse --      Resp --      Temp 03/05/16 2144 98.7 F (37.1 C)     Temp Source 03/05/16 2144 Oral     SpO2 --      Weight --      Height --      Head Cir --      Peak Flow --      Pain Score --      Pain Loc --       Pain Edu? --      Excl. in GC? --     Constitutional: Patient is alert and oriented and able to answer questions properly. She is chronically ill appearing but in no acute distress.  Eyes: Conjunctivae are normal.  EOMI. No scleral icterus. No eye discharge. Head: Atraumatic.  The patient does have some prominent kyphosis of the neck which is chronic. Nose: No congestion/rhinnorhea. Mouth/Throat: Mucous membranes are moist.  Neck: No stridor.  Supple.  No midline C-spine tenderness to palpation, step-offs or deformities. Cardiovascular: Normal rate, regular rhythm. No murmurs, rubs or gallops.  Respiratory: Normal respiratory effort.  No accessory muscle use or retractions. Lungs CTAB.  No wheezes, rales or ronchi. Musculoskeletal: Right shoulder has some swelling over the right humeral head with minimal erythema and some old bruising. She has no tenderness to palpation over the right clavicle. She has full range of motion of the right wrist, elbow without pain. Normal right radial pulse. Normal sensation to light touch in the right upper extremity. The patient does have limited range of motion of the right shoulder due to pain. Neurologic:  A&Ox3.  Speech is clear.  Face and smile are symmetric.  EOMI.  Moves all extremities well. Skin:  Skin is warm, dry and intact. No rash noted. Psychiatric: Mood and affect are normal. Speech and behavior are normal.  Normal judgement.  ____________________________________________   LABS (all labs ordered are listed, but only abnormal results are displayed)  Labs Reviewed - No data to display ____________________________________________  EKG  Not indicated.  ____________________________________________  RADIOLOGY  Dg Chest 2 View  03/05/2016  CLINICAL DATA:  Post fall 4 days ago with possible subcutaneous air right axillary region on previous right shoulder films. EXAM: CHEST  2 VIEW COMPARISON:  Right shoulder 03/05/2016 FINDINGS: Lungs  are hypoinflated with mild opacification of the left base/retrocardiac region likely mild atelectasis. No evidence pneumothorax. There is mild cardiomegaly. There is calcified plaque over the aortic arch. There are minimally displaced fractures of the anterolateral right eighth and ninth ribs. Mild subcutaneous emphysema seen over the right axillary region supraclavicular fossa. There is diffuse decreased bone mineralization. IMPRESSION: Minimal left basilar opacification likely atelectasis. Mild cardiomegaly. Minimally displaced acute fractures of the right anterior lateral eighth and ninth  ribs. Subcutaneous emphysema over the right axillary region and supraclavicular fossa. Electronically Signed   By: Elberta Fortis M.D.   On: 03/05/2016 23:30   Dg Shoulder Right  03/05/2016  CLINICAL DATA:  Right upper arm and shoulder pain after fall 2 days prior. EXAM: RIGHT SHOULDER - 2+ VIEW COMPARISON:  None. FINDINGS: The bones are under mineralized. No evidence of fracture or dislocation of the year right shoulder. Age related degenerative change. Probable air in the axillary soft tissues. No displaced fractures are seen of the included right ribs. IMPRESSION: 1. No evidence of fracture or dislocation of the right shoulder. 2. Questionable air in the right axillary subcutaneous tissues. This may be subcutaneous emphysema and seen with rib fractures, however no displaced rib fracture is seen. Recommend dedicated chest imaging. Electronically Signed   By: Rubye Oaks M.D.   On: 03/05/2016 22:37   Dg Humerus Right  03/05/2016  CLINICAL DATA:  Right humerus/arm pain after fall 2 days prior. EXAM: RIGHT HUMERUS - 2+ VIEW COMPARISON:  None. FINDINGS: Cortical margins of the humerus appear intact. There is no evidence of humerus fracture. Elbow and shoulder alignment grossly maintained. The bones are under mineralized. Questionable air in the right axillary subcutaneous tissues. Majority of the ribs are not included on  this humerus exam. IMPRESSION: 1. No evidence of fracture of the right humerus. 2. Questionable air in the right axillary subcutaneous tissues. This may be subcutaneous emphysema and seen with rib fractures, however only minimal rib evaluation on this humerus exam. Recommend dedicated chest imaging. Electronically Signed   By: Rubye Oaks M.D.   On: 03/05/2016 22:36    ____________________________________________   PROCEDURES  Procedure(s) performed: None  Critical Care performed: No ____________________________________________   INITIAL IMPRESSION / ASSESSMENT AND PLAN / ED COURSE  Pertinent labs & imaging results that were available during my care of the patient were reviewed by me and considered in my medical decision making (see chart for details).  82 y.o. F s/p fall presenting with right shoulder pain. I will evaluate the patient for fracture dislocation, consider clavicular injury or rib injury. If her workup is negative, the patient and family understand that there are other possible etiologies including ligamentous or tendon injury that cannot be excluded this evening.  ----------------------------------------- 11:32 PM on 03/05/2016 -----------------------------------------  The patient does not have any fracture or dislocation in the right shoulder. She does have some right axillary subcutaneous air, so will follow this with a chest x-ray. If this is negative plan to discharge the patient.   ____________________________________________  FINAL CLINICAL IMPRESSION(S) / ED DIAGNOSES  Final diagnoses:  Multiple rib fractures, right, closed, initial encounter  Subcutaneous air, initial encounter (HCC)      NEW MEDICATIONS STARTED DURING THIS VISIT:  New Prescriptions   OXYCODONE-ACETAMINOPHEN (PERCOCET/ROXICET) 5-325 MG TABLET    Take 1 tablet by mouth every 6 (six) hours as needed for severe pain.     Rockne Menghini, MD 03/06/16 [phone]

## 2016-03-06 MED ORDER — OXYCODONE-ACETAMINOPHEN 5-325 MG PO TABS: 1.0000 | ORAL_TABLET | Freq: Four times a day (QID) | ORAL | Status: AC | PRN

## 2016-03-06 NOTE — ED Notes (Signed)
When discharging pt the daughter became concerned that her mother could not "move right foot like usual" - the mother stated that she was moving normally and she was moving slowly but moving in the direction instructed to move - I offered to have MD check the pt again and the daughter said she was to tired to stay any longer and just to assist her in getting her mother to the car - pt was ambulated with assistance X1 to the wheelchair and rolled out front - the daughter pulled her car up to the round about and when I went to stand the pt up the daughter stated she was going to faint - I placed pt in the car and had the daughter to sit in the wheelchair - the daughter then ask her mother if she could walk - the mother stated yes she could walk and the daughter began to say "I dont know how I am going to get you in the house when we get to the apartment" - I ask the daughter again if she wanted me to take the pt back inside for eval - the daughter yelled "no I will call security and deal with it myself because obviously you arent going to deal with it" - I then attempted to buckle pt seat belt and the daughter became agitated and yelled "leave it alone she isnt wearing one" (because she felt the seat belt was hurting her mothers shoulder - daughter slammed door stating that she was to tired to be dealing with this and had been up since 6am

## 2016-03-06 NOTE — Discharge Instructions (Signed)
Please use the incentive spirometer every hour when you are awake to prevent pneumonia. Please make sure that you're taking Tylenol and Motrin for mild to moderate pain and Percocet for severe pain, see can take deep breaths and prevent infection.  Return to the emergency department if you develop cough, fever, changes in mental status, or any other symptoms concerning to you.  Rib Fracture A rib fracture is a break or crack in one of the bones of the ribs. The ribs are a group of long, curved bones that wrap around your chest and attach to your spine. They protect your lungs and other organs in the chest cavity. A broken or cracked rib is often painful, but most do not cause other problems. Most rib fractures heal on their own over time. However, rib fractures can be more serious if multiple ribs are broken or if broken ribs move out of place and push against other structures. CAUSES   A direct blow to the chest. For example, this could happen during contact sports, a car accident, or a fall against a hard object.  Repetitive movements with high force, such as pitching a baseball or having severe coughing spells. SYMPTOMS   Pain when you breathe in or cough.  Pain when someone presses on the injured area. DIAGNOSIS  Your caregiver will perform a physical exam. Various imaging tests may be ordered to confirm the diagnosis and to look for related injuries. These tests may include a chest X-ray, computed tomography (CT), magnetic resonance imaging (MRI), or a bone scan. TREATMENT  Rib fractures usually heal on their own in 1-3 months. The longer healing period is often associated with a continued cough or other aggravating activities. During the healing period, pain control is very important. Medication is usually given to control pain. Hospitalization or surgery may be needed for more severe injuries, such as those in which multiple ribs are broken or the ribs have moved out of place.  HOME CARE  INSTRUCTIONS   Avoid strenuous activity and any activities or movements that cause pain. Be careful during activities and avoid bumping the injured rib.  Gradually increase activity as directed by your caregiver.  Only take over-the-counter or prescription medications as directed by your caregiver. Do not take other medications without asking your caregiver first.  Apply ice to the injured area for the first 1-2 days after you have been treated or as directed by your caregiver. Applying ice helps to reduce inflammation and pain.  Put ice in a plastic bag.  Place a towel between your skin and the bag.   Leave the ice on for 15-20 minutes at a time, every 2 hours while you are awake.  Perform deep breathing as directed by your caregiver. This will help prevent pneumonia, which is a common complication of a broken rib. Your caregiver may instruct you to:  Take deep breaths several times a day.  Try to cough several times a day, holding a pillow against the injured area.  Use a device called an incentive spirometer to practice deep breathing several times a day.  Drink enough fluids to keep your urine clear or pale yellow. This will help you avoid constipation.   Do not wear a rib belt or binder. These restrict breathing, which can lead to pneumonia.  SEEK IMMEDIATE MEDICAL CARE IF:   You have a fever.   You have difficulty breathing or shortness of breath.   You develop a continual cough, or you cough up thick  or bloody sputum.  You feel sick to your stomach (nausea), throw up (vomit), or have abdominal pain.   You have worsening pain not controlled with medications.  MAKE SURE YOU:  Understand these instructions.  Will watch your condition.  Will get help right away if you are not doing well or get worse.   This information is not intended to replace advice given to you by your health care provider. Make sure you discuss any questions you have with your health care  provider.   Document Released: 09/30/2005 Document Revised: 06/02/2013 Document Reviewed: 12/02/2012 Elsevier Interactive Patient Education [employer]

## 2016-03-08 DIAGNOSIS — L02412 Cutaneous abscess of left axilla: Secondary | ICD-10-CM | POA: Diagnosis not present

## 2016-03-08 DIAGNOSIS — S40011A Contusion of right shoulder, initial encounter: Secondary | ICD-10-CM

## 2016-03-13 DIAGNOSIS — I482 Chronic atrial fibrillation: Secondary | ICD-10-CM

## 2016-03-13 DIAGNOSIS — I10 Essential (primary) hypertension: Secondary | ICD-10-CM

## 2016-03-13 DIAGNOSIS — F0150 Vascular dementia without behavioral disturbance: Secondary | ICD-10-CM

## 2016-03-13 DIAGNOSIS — S2241XA Multiple fractures of ribs, right side, initial encounter for closed fracture: Secondary | ICD-10-CM

## 2016-03-13 DIAGNOSIS — B0229 Other postherpetic nervous system involvement: Secondary | ICD-10-CM

## 2016-04-11 DIAGNOSIS — I482 Chronic atrial fibrillation: Secondary | ICD-10-CM | POA: Diagnosis not present

## 2016-04-11 DIAGNOSIS — E785 Hyperlipidemia, unspecified: Secondary | ICD-10-CM

## 2016-04-11 DIAGNOSIS — I10 Essential (primary) hypertension: Secondary | ICD-10-CM

## 2016-04-11 DIAGNOSIS — I69398 Other sequelae of cerebral infarction: Secondary | ICD-10-CM

## 2016-04-11 DIAGNOSIS — F0150 Vascular dementia without behavioral disturbance: Secondary | ICD-10-CM

## 2016-05-01 DIAGNOSIS — E785 Hyperlipidemia, unspecified: Secondary | ICD-10-CM | POA: Diagnosis not present

## 2016-05-01 DIAGNOSIS — I4891 Unspecified atrial fibrillation: Secondary | ICD-10-CM | POA: Diagnosis not present

## 2016-05-01 DIAGNOSIS — B0229 Other postherpetic nervous system involvement: Secondary | ICD-10-CM

## 2016-05-01 DIAGNOSIS — I10 Essential (primary) hypertension: Secondary | ICD-10-CM

## 2016-05-01 DIAGNOSIS — I69398 Other sequelae of cerebral infarction: Secondary | ICD-10-CM | POA: Diagnosis not present

## 2016-05-01 DIAGNOSIS — F0150 Vascular dementia without behavioral disturbance: Secondary | ICD-10-CM

## 2016-05-30 ENCOUNTER — Ambulatory Visit (INDEPENDENT_AMBULATORY_CARE_PROVIDER_SITE_OTHER): Payer: Medicare Other | Admitting: Podiatry

## 2016-05-30 ENCOUNTER — Encounter: Payer: Self-pay | Admitting: Podiatry

## 2016-05-30 DIAGNOSIS — B351 Tinea unguium: Secondary | ICD-10-CM

## 2016-05-30 DIAGNOSIS — M79675 Pain in left toe(s): Secondary | ICD-10-CM | POA: Diagnosis not present

## 2016-05-30 DIAGNOSIS — M79674 Pain in right toe(s): Secondary | ICD-10-CM

## 2016-05-31 NOTE — Progress Notes (Signed)
Subjective: 82 y.o. returns the office today for painful, elongated, thickened toenails which she cannot trim herself. Denies any redness or drainage around the nails. Denies any acute changes since last appointment and no new complaints today. Denies any systemic complaints such as fevers, chills, nausea, vomiting.   Objective: AAO 3, NAD DP/PT pulses palpable, CRT less than 3 seconds Nails hypertrophic, dystrophic, elongated, brittle, discolored 10. There is tenderness overlying the nails 1-5 bilaterally. There is no surrounding erythema or drainage along the nail sites. No open lesions or pre-ulcerative lesions are identified. No other areas of tenderness bilateral lower extremities. No overlying edema, erythema, increased warmth. No pain with calf compression, swelling, warmth, erythema.  Assessment: Patient presents with symptomatic onychomycosis  Plan: -Treatment options including alternatives, risks, complications were discussed -Nails sharply debrided 10 without complication/bleeding. -Discussed daily foot inspection. If there are any changes, to call the office immediately.  -Follow-up in 3 months or sooner if any problems are to arise. In the meantime, encouraged to call the office with any questions, concerns, changes symptoms.  Thailan Sava, DPM  

## 2016-06-06 DIAGNOSIS — I69398 Other sequelae of cerebral infarction: Secondary | ICD-10-CM | POA: Diagnosis not present

## 2016-06-06 DIAGNOSIS — F0150 Vascular dementia without behavioral disturbance: Secondary | ICD-10-CM

## 2016-06-06 DIAGNOSIS — I10 Essential (primary) hypertension: Secondary | ICD-10-CM

## 2016-06-06 DIAGNOSIS — I482 Chronic atrial fibrillation: Secondary | ICD-10-CM | POA: Diagnosis not present

## 2016-06-20 DIAGNOSIS — M79645 Pain in left finger(s): Secondary | ICD-10-CM | POA: Diagnosis not present

## 2016-08-02 DIAGNOSIS — B309 Viral conjunctivitis, unspecified: Secondary | ICD-10-CM | POA: Diagnosis not present

## 2016-08-30 ENCOUNTER — Ambulatory Visit (INDEPENDENT_AMBULATORY_CARE_PROVIDER_SITE_OTHER): Payer: Medicare Other | Admitting: Podiatry

## 2016-08-30 VITALS — BP 131/61 | HR 57 | Resp 16

## 2016-08-30 DIAGNOSIS — B351 Tinea unguium: Secondary | ICD-10-CM

## 2016-08-30 DIAGNOSIS — L608 Other nail disorders: Secondary | ICD-10-CM

## 2016-08-30 DIAGNOSIS — M79609 Pain in unspecified limb: Secondary | ICD-10-CM | POA: Diagnosis not present

## 2016-08-30 DIAGNOSIS — L603 Nail dystrophy: Secondary | ICD-10-CM

## 2016-09-15 NOTE — Progress Notes (Signed)
SUBJECTIVE Patient  presents to office today complaining of elongated, thickened nails. Pain while ambulating in shoes. Patient is unable to trim their own nails.   OBJECTIVE General Patient is awake, alert, and oriented x 3 and in no acute distress. Derm Skin is dry and supple bilateral. Negative open lesions or macerations. Remaining integument unremarkable. Nails are tender, long, thickened and dystrophic with subungual debris, consistent with onychomycosis, 1-5 bilateral. No signs of infection noted. Vasc  DP and PT pedal pulses palpable bilaterally. Temperature gradient within normal limits.  Neuro Epicritic and protective threshold sensation diminished bilaterally.  Musculoskeletal Exam No symptomatic pedal deformities noted bilateral. Muscular strength within normal limits.  ASSESSMENT 1. Onychodystrophic nails 1-5 bilateral with hyperkeratosis of nails.  2. Onychomycosis of nail due to dermatophyte bilateral 3. Pain in foot bilateral  PLAN OF CARE 1. Patient evaluated today.  2. Instructed to maintain good pedal hygiene and foot care.  3. Mechanical debridement of nails 1-5 bilaterally performed using a nail nipper. Filed with dremel without incident.  4. Return to clinic in 3 mos.    Kristina Aguirre M Galan Ghee, DPM    

## 2016-10-08 DIAGNOSIS — M792 Neuralgia and neuritis, unspecified: Secondary | ICD-10-CM

## 2016-10-08 DIAGNOSIS — S300XXA Contusion of lower back and pelvis, initial encounter: Secondary | ICD-10-CM

## 2016-10-08 DIAGNOSIS — F0150 Vascular dementia without behavioral disturbance: Secondary | ICD-10-CM | POA: Diagnosis not present

## 2016-10-08 DIAGNOSIS — I10 Essential (primary) hypertension: Secondary | ICD-10-CM | POA: Diagnosis not present

## 2016-10-17 ENCOUNTER — Encounter: Payer: Self-pay | Admitting: Urgent Care

## 2016-10-17 ENCOUNTER — Emergency Department: Payer: Medicare Other

## 2016-10-17 ENCOUNTER — Inpatient Hospital Stay
Admission: EM | Admit: 2016-10-17 | Discharge: 2016-10-23 | DRG: 481 | Disposition: A | Discharge status: Skilled Nursing Facility | Payer: Medicare Other | Attending: Internal Medicine | Admitting: Internal Medicine

## 2016-10-17 DIAGNOSIS — S72142A Displaced intertrochanteric fracture of left femur, initial encounter for closed fracture: Secondary | ICD-10-CM | POA: Diagnosis not present

## 2016-10-17 DIAGNOSIS — Z7901 Long term (current) use of anticoagulants: Secondary | ICD-10-CM

## 2016-10-17 DIAGNOSIS — B9689 Other specified bacterial agents as the cause of diseases classified elsewhere: Secondary | ICD-10-CM | POA: Diagnosis present

## 2016-10-17 DIAGNOSIS — I739 Peripheral vascular disease, unspecified: Secondary | ICD-10-CM | POA: Diagnosis present

## 2016-10-17 DIAGNOSIS — S72002A Fracture of unspecified part of neck of left femur, initial encounter for closed fracture: Secondary | ICD-10-CM

## 2016-10-17 DIAGNOSIS — R509 Fever, unspecified: Secondary | ICD-10-CM

## 2016-10-17 DIAGNOSIS — Z8673 Personal history of transient ischemic attack (TIA), and cerebral infarction without residual deficits: Secondary | ICD-10-CM

## 2016-10-17 DIAGNOSIS — N289 Disorder of kidney and ureter, unspecified: Secondary | ICD-10-CM | POA: Diagnosis present

## 2016-10-17 DIAGNOSIS — M1990 Unspecified osteoarthritis, unspecified site: Secondary | ICD-10-CM | POA: Diagnosis present

## 2016-10-17 DIAGNOSIS — M25552 Pain in left hip: Secondary | ICD-10-CM | POA: Diagnosis not present

## 2016-10-17 DIAGNOSIS — J9811 Atelectasis: Secondary | ICD-10-CM | POA: Diagnosis present

## 2016-10-17 DIAGNOSIS — F0150 Vascular dementia without behavioral disturbance: Secondary | ICD-10-CM | POA: Diagnosis present

## 2016-10-17 DIAGNOSIS — Z66 Do not resuscitate: Secondary | ICD-10-CM | POA: Diagnosis present

## 2016-10-17 DIAGNOSIS — M6281 Muscle weakness (generalized): Secondary | ICD-10-CM

## 2016-10-17 DIAGNOSIS — Z681 Body mass index (BMI) 19 or less, adult: Secondary | ICD-10-CM

## 2016-10-17 DIAGNOSIS — N390 Urinary tract infection, site not specified: Secondary | ICD-10-CM | POA: Diagnosis present

## 2016-10-17 DIAGNOSIS — W19XXXA Unspecified fall, initial encounter: Secondary | ICD-10-CM

## 2016-10-17 DIAGNOSIS — Z880 Allergy status to penicillin: Secondary | ICD-10-CM

## 2016-10-17 DIAGNOSIS — E7800 Pure hypercholesterolemia, unspecified: Secondary | ICD-10-CM | POA: Diagnosis present

## 2016-10-17 DIAGNOSIS — B0229 Other postherpetic nervous system involvement: Secondary | ICD-10-CM | POA: Diagnosis present

## 2016-10-17 DIAGNOSIS — R627 Adult failure to thrive: Secondary | ICD-10-CM | POA: Diagnosis present

## 2016-10-17 DIAGNOSIS — I480 Paroxysmal atrial fibrillation: Secondary | ICD-10-CM | POA: Diagnosis present

## 2016-10-17 DIAGNOSIS — Z419 Encounter for procedure for purposes other than remedying health state, unspecified: Secondary | ICD-10-CM

## 2016-10-17 DIAGNOSIS — I10 Essential (primary) hypertension: Secondary | ICD-10-CM | POA: Diagnosis present

## 2016-10-17 DIAGNOSIS — S72009A Fracture of unspecified part of neck of unspecified femur, initial encounter for closed fracture: Secondary | ICD-10-CM

## 2016-10-17 DIAGNOSIS — W010XXA Fall on same level from slipping, tripping and stumbling without subsequent striking against object, initial encounter: Secondary | ICD-10-CM | POA: Diagnosis present

## 2016-10-17 DIAGNOSIS — R262 Difficulty in walking, not elsewhere classified: Secondary | ICD-10-CM

## 2016-10-17 HISTORY — DX: Vascular dementia, unspecified severity, without behavioral disturbance, psychotic disturbance, mood disturbance, and anxiety: F01.50

## 2016-10-17 LAB — CBC
HEMATOCRIT: 36.8 % (ref 35.0–47.0)
Hemoglobin: 12.5 g/dL (ref 12.0–16.0)
MCH: 31.2 pg (ref 26.0–34.0)
MCHC: 34 g/dL (ref 32.0–36.0)
MCV: 91.8 fL (ref 80.0–100.0)
Platelets: 343 10*3/uL (ref 150–440)
RBC: 4 MIL/uL (ref 3.80–5.20)
RDW: 13.6 % (ref 11.5–14.5)
WBC: 15.7 10*3/uL — ABNORMAL HIGH (ref 3.6–11.0)

## 2016-10-17 LAB — BASIC METABOLIC PANEL
Anion gap: 9 (ref 5–15)
BUN: 52 mg/dL — AB (ref 6–20)
CHLORIDE: 102 mmol/L (ref 101–111)
CO2: 25 mmol/L (ref 22–32)
CREATININE: 1.97 mg/dL — AB (ref 0.44–1.00)
Calcium: 8.4 mg/dL — ABNORMAL LOW (ref 8.9–10.3)
GFR calc Af Amer: 25 mL/min — ABNORMAL LOW (ref >60)
GFR calc non Af Amer: 22 mL/min — ABNORMAL LOW (ref >60)
Glucose, Bld: 152 mg/dL — ABNORMAL HIGH (ref 65–99)
POTASSIUM: 4.1 mmol/L (ref 3.5–5.1)
Sodium: 136 mmol/L (ref 135–145)

## 2016-10-17 LAB — APTT: aPTT: 41 s — ABNORMAL HIGH (ref 24–36)

## 2016-10-17 LAB — TYPE AND SCREEN
ABO/RH(D): B POS
Antibody Screen: NEGATIVE

## 2016-10-17 LAB — TROPONIN I: Troponin I: <0.03 ng/mL (ref <0.03)

## 2016-10-17 LAB — PROTIME-INR
INR: 1.67
Prothrombin Time: 19.9 s — ABNORMAL HIGH (ref 11.4–15.2)

## 2016-10-17 MED ORDER — FENTANYL CITRATE (PF) 100 MCG/2ML IJ SOLN
50.0000 ug | Freq: Once | INTRAMUSCULAR | Status: AC
Start: 2016-10-17 — End: 2016-10-17
  Administered 2016-10-17: 50 ug via INTRAVENOUS

## 2016-10-17 MED ORDER — ACETAMINOPHEN 10 MG/ML IV SOLN
1000.0000 mg | Freq: Four times a day (QID) | INTRAVENOUS | Status: AC
  Administered 2016-10-17 – 2016-10-18 (×3): 1000 mg via INTRAVENOUS
  Filled 2016-10-17 (×5): qty 100

## 2016-10-17 MED ORDER — FENTANYL CITRATE (PF) 100 MCG/2ML IJ SOLN
INTRAMUSCULAR | Status: AC
  Administered 2016-10-17: 50 ug via INTRAVENOUS
  Filled 2016-10-17: qty 2

## 2016-10-17 MED ORDER — FENTANYL CITRATE (PF) 100 MCG/2ML IJ SOLN
25.0000 ug | INTRAMUSCULAR | Status: DC | PRN
  Administered 2016-10-17: 25 ug via INTRAVENOUS

## 2016-10-17 NOTE — ED Triage Notes (Addendum)
Patient [facility] to ED [facility] from [facility] s/p an unwitnessed elderly fall. Patient reported to have just "slid in the floor" per EMS; patient unsure of the circumstances surrounding her fall. Patient reports that she "thinks" that she may have hit her head; denies LOC. Present CAO x 3. Patient with (+) pain to LEFT hip; (+) shortening and rotation; (+) P/M/S noted distally. Fall occurred at about 1900; LTCF gave patient a Percocet and Tramadol following the fall. Slightly HYPOxic upon arrival; sats 89% on RA. Patient placed on supplemental oxygen at 2L/[place]; sats improved to 95%.

## 2016-10-17 NOTE — ED Provider Notes (Signed)
[facility] Emergency Department Provider Note    First MD Initiated Contact with Patient 10/17/16 2136     (approximate)  I have reviewed the triage vital signs and the nursing notes.   HISTORY  Chief Complaint Fall and Hip Pain    HPI Kristina Aguirre is a 83 y.o. female on Eliquis for history of A. fib and stroke presents from nursing facility with unwitnessed fall and complaint of left hip pain. Patient is amnestic to the event and uncertain of circumstances around her fall. Patient did receive Percocet and tramadol prior to arrival and did have mild hypoxia. Denies any other associated pain.   Past Medical History:  Diagnosis Date  . Arthritis   . Hematuria   . High cholesterol   . Hypertension   . Post herpetic neuralgia    Right side of neck  . Renal disorder   . Shingles   . TIA (transient ischemic attack)    Family History  Problem Relation Age of Onset  . Adopted: Yes   Past Surgical History:  Procedure Laterality Date  . TUBAL LIGATION     Patient Active Problem List   Diagnosis Date Noted  . CVA (cerebrovascular accident) (HCC) 11/08/2014  . Acute embolic stroke (HCC) 11/08/2014  . Paroxysmal atrial fibrillation (HCC) 08/23/2014  . CVA (cerebral vascular accident) (HCC) 08/21/2014  . Essential hypertension 08/21/2014  . Postherpetic neuralgia 08/21/2014  . HLD (hyperlipidemia) 08/21/2014      Prior to Admission medications   Medication Sig Start Date End Date Taking? Authorizing Provider  amLODipine (NORVASC) 2.5 MG tablet Take 2.5 mg by mouth daily.    Historical Provider, MD  apixaban (ELIQUIS) 2.5 MG TABS tablet Take 1 tablet (2.5 mg total) by mouth 2 (two) times daily. 08/24/14   Servando SnareAlexa R Burns, MD  gabapentin (NEURONTIN) 100 MG capsule Take 200-300 mg by mouth 2 (two) times daily. 300mg  in the morning and 200mg  at bedtime    Historical Provider, MD  loratadine (CLARITIN) 10 MG tablet Take 10 mg by mouth daily.     Historical Provider, MD  OVER THE COUNTER MEDICATION Place 1 drop into both eyes at bedtime. Over the counter eye drops for dry eyes    Historical Provider, MD  oxyCODONE-acetaminophen (PERCOCET/ROXICET) 5-325 MG tablet Take 1 tablet by mouth every 6 (six) hours as needed for severe pain. 03/06/16   Anne-Caroline Sharma CovertNorman, MD  simvastatin (ZOCOR) 20 MG tablet Take 20 mg by mouth at bedtime.     Historical Provider, MD  traMADol (ULTRAM) 50 MG tablet Take 50-100 mg by mouth 3 (three) times daily. Take 1 tablet (50 mg) every morning and at bedtime, take 2 tablets (100 mg) daily at 3pm    Historical Provider, MD    Allergies Penicillins    Social History Social History  Substance Use Topics  . Smoking status: Never Smoker  . Smokeless tobacco: Never Used  . Alcohol use 0.6 oz/week    1 Glasses of wine per week     Comment: "I like a glass of wine occasionally"    Review of Systems Patient denies headaches, rhinorrhea, blurry vision, numbness, shortness of breath, chest pain, edema, cough, abdominal pain, nausea, vomiting, diarrhea, dysuria, fevers, rashes or hallucinations unless otherwise stated above in HPI. ____________________________________________   PHYSICAL EXAM:  VITAL SIGNS: Vitals:   10/17/16 2149 10/17/16 2238  BP: (!) 147/67 129/68  Pulse: 66   Resp: 14   Temp: 97.4 F (36.3 C)  Constitutional: Frail and elderly female in no acute distress but does appear comfortable lying in bed.  Eyes: Conjunctivae are normal. PERRL. EOMI. Head: Atraumatic. Nose: No congestion/rhinnorhea. Mouth/Throat: Mucous membranes are moist.  Oropharynx non-erythematous. Neck: No stridor. Painless ROM. No cervical spine tenderness to palpation Hematological/Lymphatic/Immunilogical: No cervical lymphadenopathy. Cardiovascular: Irregularly irregular rhythm. Grossly normal heart sounds.  Good peripheral circulation. Respiratory: Normal respiratory effort.  No retractions. Lungs coarse  bibasilar breath sounds Gastrointestinal: Soft and nontender. No distention. No abdominal bruits. No CVA tenderness. Musculoskeletal: No distal tenderness to palpation. Does have pain with log roll of left hip. Neurologic:  Normal speech and language. No gross focal neurologic deficits are appreciated. Psychiatric: Mood and affect are normal. Speech and behavior are normal.  ____________________________________________   LABS (all labs ordered are listed, but only abnormal results are displayed)  Results for orders placed or performed during the hospital encounter of 10/17/16 (from the past 24 hour(s))  CBC     Status: Abnormal   Collection Time: 10/17/16  9:56 PM  Result Value Ref Range   WBC 15.7 (H) 3.6 - 11.0 K/uL   RBC 4.00 3.80 - 5.20 MIL/uL   Hemoglobin 12.5 12.0 - 16.0 g/dL   HCT 16.1 09.6 - 04.5 %   MCV 91.8 80.0 - 100.0 fL   MCH 31.2 26.0 - 34.0 pg   MCHC 34.0 32.0 - 36.0 g/dL   RDW 40.9 81.1 - 91.4 %   Platelets 343 150 - 440 K/uL  Basic metabolic panel     Status: Abnormal   Collection Time: 10/17/16  9:56 PM  Result Value Ref Range   Sodium 136 135 - 145 mmol/L   Potassium 4.1 3.5 - 5.1 mmol/L   Chloride 102 101 - 111 mmol/L   CO2 25 22 - 32 mmol/L   Glucose, Bld 152 (H) 65 - 99 mg/dL   BUN 52 (H) 6 - 20 mg/dL   Creatinine, Ser 7.82 (H) 0.44 - 1.00 mg/dL   Calcium 8.4 (L) 8.9 - 10.3 mg/dL   GFR calc non Af Amer 22 (L) >60 mL/min   GFR calc Af Amer 25 (L) >60 mL/min   Anion gap 9 5 - 15  Type and screen [facility]     Status: None (Preliminary result)   Collection Time: 10/17/16  9:56 PM  Result Value Ref Range   ABO/RH(D) PENDING    Antibody Screen PENDING    Sample Expiration 10/20/2016   Protime-INR     Status: Abnormal   Collection Time: 10/17/16  9:56 PM  Result Value Ref Range   Prothrombin Time 19.9 (H) 11.4 - 15.2 seconds   INR 1.67   APTT     Status: Abnormal   Collection Time: 10/17/16  9:56 PM  Result Value Ref Range    aPTT 41 (H) 24 - 36 seconds   ____________________________________________  EKG My review and personal interpretation at Time: 22:43   Indication: fall  Rate: 75  Rhythm: afib Axis: normal Other: non specific st and t wave inversions no acute ischemia ____________________________________________  RADIOLOGY  I personally reviewed all radiographic images ordered to evaluate for the above acute complaints and reviewed radiology reports and findings.  These findings were personally discussed with the patient.  Please see medical record for radiology report. ____________________________________________   PROCEDURES  Procedure(s) performed:  Procedures    Critical Care performed: no ____________________________________________   INITIAL IMPRESSION / ASSESSMENT AND PLAN / ED COURSE  Pertinent labs & imaging  results that were available during my care of the patient were reviewed by me and considered in my medical decision making (see chart for details).  DDX: Fracture, dislocation, contusion, traumatic brain injury, dysrhythmia  Kristina Aguirre is a 83 y.o. who presents to the ED with complaint of acute left hip pain after unwitnessed fall. Patient arrives afebrile and hemodynamically stable but does have mild hypoxia. I do suspect this is secondary to atelectasis ordered respiratory depression after receiving narcotic medications given her age and frailty. Will order CT imaging to evaluate for acute traumatic injury. X-ray of the hip was ordered to evaluate for evidence of fracture. CT head and neck shows no evidence of acute traumatic injury. She has no focal neurologic deficits at this time. Chest x-ray without any evidence of pneumonia. X-ray of the hip that showed evidence of left intertrochanteric fracture with displacement. Patient will require admission to the hospital for further evaluation and management. Presentation complicated by her anticoagulation on Eliquis. Does have mild  leukocytosis but it is suspected secondary to trauma she is afebrile.  Have discussed with the patient and available family all diagnostics and treatments performed thus far and all questions were answered to the best of my ability. The patient demonstrates understanding and agreement with plan.   Clinical Course      ____________________________________________   FINAL CLINICAL IMPRESSION(S) / ED DIAGNOSES  Final diagnoses:  Closed 2-part intertrochanteric fracture of left femur, initial encounter (HCC)  Acute hip pain, left      NEW MEDICATIONS STARTED DURING THIS VISIT:  New Prescriptions   No medications on file     Note:  This document was prepared using Dragon voice recognition software and may include unintentional dictation errors.    Willy Eddy, MD 10/18/16 [phone]

## 2016-10-17 NOTE — ED Notes (Signed)
Patient reports that her pain has improved, but still present; rates 7/10 at present. IV Ofrimev hung and set to infuse per MD order.

## 2016-10-17 NOTE — ED Notes (Signed)
Radiology called to report a (+) fracture. Asking for CXR order. [facility].

## 2016-10-17 NOTE — ED Notes (Signed)
Patient to radiology at this time for ordered plain films of hip and CT scans of head and neck.

## 2016-10-17 NOTE — ED Notes (Signed)
MD made aware of (+) LEFT intertrochanteric hip fracture. Patient still in radiology, however c/o pain rated 10/10 despite Percocet and Tramadol. MD with [facility] for Fentanyl 50mcg IV; [facility] and carried by emergency department nursing staff.

## 2016-10-18 ENCOUNTER — Inpatient Hospital Stay: Payer: Medicare Other | Admitting: Anesthesiology

## 2016-10-18 ENCOUNTER — Encounter: Payer: Self-pay | Admitting: Orthopedic Surgery

## 2016-10-18 ENCOUNTER — Telehealth: Payer: Self-pay

## 2016-10-18 DIAGNOSIS — Z8673 Personal history of transient ischemic attack (TIA), and cerebral infarction without residual deficits: Secondary | ICD-10-CM | POA: Diagnosis not present

## 2016-10-18 DIAGNOSIS — M25552 Pain in left hip: Secondary | ICD-10-CM | POA: Diagnosis present

## 2016-10-18 DIAGNOSIS — J9811 Atelectasis: Secondary | ICD-10-CM | POA: Diagnosis present

## 2016-10-18 DIAGNOSIS — W010XXA Fall on same level from slipping, tripping and stumbling without subsequent striking against object, initial encounter: Secondary | ICD-10-CM | POA: Diagnosis present

## 2016-10-18 DIAGNOSIS — Z880 Allergy status to penicillin: Secondary | ICD-10-CM | POA: Diagnosis not present

## 2016-10-18 DIAGNOSIS — S72009A Fracture of unspecified part of neck of unspecified femur, initial encounter for closed fracture: Secondary | ICD-10-CM | POA: Diagnosis present

## 2016-10-18 DIAGNOSIS — I10 Essential (primary) hypertension: Secondary | ICD-10-CM | POA: Diagnosis present

## 2016-10-18 DIAGNOSIS — I739 Peripheral vascular disease, unspecified: Secondary | ICD-10-CM | POA: Diagnosis present

## 2016-10-18 DIAGNOSIS — N390 Urinary tract infection, site not specified: Secondary | ICD-10-CM | POA: Diagnosis present

## 2016-10-18 DIAGNOSIS — B0229 Other postherpetic nervous system involvement: Secondary | ICD-10-CM | POA: Diagnosis present

## 2016-10-18 DIAGNOSIS — E7800 Pure hypercholesterolemia, unspecified: Secondary | ICD-10-CM | POA: Diagnosis present

## 2016-10-18 DIAGNOSIS — S72142A Displaced intertrochanteric fracture of left femur, initial encounter for closed fracture: Secondary | ICD-10-CM | POA: Diagnosis present

## 2016-10-18 DIAGNOSIS — N289 Disorder of kidney and ureter, unspecified: Secondary | ICD-10-CM | POA: Diagnosis present

## 2016-10-18 DIAGNOSIS — F0150 Vascular dementia without behavioral disturbance: Secondary | ICD-10-CM | POA: Diagnosis present

## 2016-10-18 DIAGNOSIS — Z7901 Long term (current) use of anticoagulants: Secondary | ICD-10-CM | POA: Diagnosis not present

## 2016-10-18 DIAGNOSIS — Z681 Body mass index (BMI) 19 or less, adult: Secondary | ICD-10-CM | POA: Diagnosis not present

## 2016-10-18 DIAGNOSIS — M1990 Unspecified osteoarthritis, unspecified site: Secondary | ICD-10-CM | POA: Diagnosis present

## 2016-10-18 DIAGNOSIS — R627 Adult failure to thrive: Secondary | ICD-10-CM | POA: Diagnosis present

## 2016-10-18 DIAGNOSIS — Z66 Do not resuscitate: Secondary | ICD-10-CM | POA: Diagnosis present

## 2016-10-18 DIAGNOSIS — Z87442 Personal history of urinary calculi: Secondary | ICD-10-CM | POA: Insufficient documentation

## 2016-10-18 DIAGNOSIS — B9689 Other specified bacterial agents as the cause of diseases classified elsewhere: Secondary | ICD-10-CM | POA: Diagnosis present

## 2016-10-18 DIAGNOSIS — I480 Paroxysmal atrial fibrillation: Secondary | ICD-10-CM | POA: Diagnosis present

## 2016-10-18 DIAGNOSIS — Z8669 Personal history of other diseases of the nervous system and sense organs: Secondary | ICD-10-CM | POA: Insufficient documentation

## 2016-10-18 LAB — BASIC METABOLIC PANEL
Anion gap: 8 (ref 5–15)
BUN: 51 mg/dL — AB (ref 6–20)
CHLORIDE: 105 mmol/L (ref 101–111)
CO2: 24 mmol/L (ref 22–32)
Calcium: 8.7 mg/dL — ABNORMAL LOW (ref 8.9–10.3)
Creatinine, Ser: 1.97 mg/dL — ABNORMAL HIGH (ref 0.44–1.00)
GFR calc Af Amer: 25 mL/min — ABNORMAL LOW (ref >60)
GFR calc non Af Amer: 22 mL/min — ABNORMAL LOW (ref >60)
GLUCOSE: 114 mg/dL — AB (ref 65–99)
POTASSIUM: 4.6 mmol/L (ref 3.5–5.1)
Sodium: 137 mmol/L (ref 135–145)

## 2016-10-18 LAB — URINALYSIS, COMPLETE (UACMP) WITH MICROSCOPIC
Bacteria, UA: NONE SEEN
Bilirubin Urine: NEGATIVE
Glucose, UA: NEGATIVE mg/dL
Ketones, ur: NEGATIVE mg/dL
Nitrite: NEGATIVE
PH: 5 (ref 5.0–8.0)
Protein, ur: 100 mg/dL — AB
Specific Gravity, Urine: 1.018 (ref 1.005–1.030)

## 2016-10-18 LAB — CBC
HEMATOCRIT: 36.3 % (ref 35.0–47.0)
Hemoglobin: 12.5 g/dL (ref 12.0–16.0)
MCH: 31.8 pg (ref 26.0–34.0)
MCHC: 34.4 g/dL (ref 32.0–36.0)
MCV: 92.4 fL (ref 80.0–100.0)
Platelets: 332 10*3/uL (ref 150–440)
RBC: 3.93 MIL/uL (ref 3.80–5.20)
RDW: 13.9 % (ref 11.5–14.5)
WBC: 14.8 10*3/uL — AB (ref 3.6–11.0)

## 2016-10-18 LAB — PROTIME-INR
INR: 1.59
Prothrombin Time: 19.1 seconds — ABNORMAL HIGH (ref 11.4–15.2)

## 2016-10-18 LAB — MRSA PCR SCREENING: MRSA by PCR: NEGATIVE

## 2016-10-18 MED ORDER — LORATADINE 10 MG PO TABS
10.0000 mg | ORAL_TABLET | Freq: Every day | ORAL | Status: DC
  Administered 2016-10-18 – 2016-10-23 (×5): 10 mg via ORAL
  Filled 2016-10-18 (×5): qty 1

## 2016-10-18 MED ORDER — SODIUM CHLORIDE 0.9% FLUSH
3.0000 mL | Freq: Two times a day (BID) | INTRAVENOUS | Status: DC
  Administered 2016-10-18 – 2016-10-20 (×2): 3 mL via INTRAVENOUS

## 2016-10-18 MED ORDER — GABAPENTIN 400 MG PO CAPS
400.0000 mg | ORAL_CAPSULE | Freq: Three times a day (TID) | ORAL | Status: DC
  Administered 2016-10-18 – 2016-10-23 (×14): 400 mg via ORAL
  Filled 2016-10-18 (×14): qty 1

## 2016-10-18 MED ORDER — CLINDAMYCIN PHOSPHATE 600 MG/50ML IV SOLN: 600.0000 mg | INTRAVENOUS | Status: AC

## 2016-10-18 MED ORDER — ONDANSETRON HCL 4 MG/2ML IJ SOLN: 4.0000 mg | Freq: Four times a day (QID) | INTRAMUSCULAR | Status: DC | PRN

## 2016-10-18 MED ORDER — SODIUM CHLORIDE 0.9 % IV SOLN
INTRAVENOUS | Status: DC
  Administered 2016-10-18 – 2016-10-21 (×5): via INTRAVENOUS

## 2016-10-18 MED ORDER — AMLODIPINE BESYLATE 5 MG PO TABS
2.5000 mg | ORAL_TABLET | Freq: Every day | ORAL | Status: DC
  Administered 2016-10-18 – 2016-10-23 (×4): 2.5 mg via ORAL
  Filled 2016-10-18 (×4): qty 1

## 2016-10-18 MED ORDER — SIMVASTATIN 20 MG PO TABS
20.0000 mg | ORAL_TABLET | Freq: Every day | ORAL | Status: DC
  Administered 2016-10-18 – 2016-10-22 (×5): 20 mg via ORAL
  Filled 2016-10-18 (×5): qty 1

## 2016-10-18 MED ORDER — ACETAMINOPHEN 325 MG PO TABS
650.0000 mg | ORAL_TABLET | Freq: Four times a day (QID) | ORAL | Status: DC | PRN
  Administered 2016-10-19 – 2016-10-20 (×2): 650 mg via ORAL
  Filled 2016-10-18 (×2): qty 2

## 2016-10-18 MED ORDER — ACETAMINOPHEN 650 MG RE SUPP: 650.0000 mg | Freq: Four times a day (QID) | RECTAL | Status: DC | PRN

## 2016-10-18 MED ORDER — SENNOSIDES-DOCUSATE SODIUM 8.6-50 MG PO TABS: 1.0000 | ORAL_TABLET | Freq: Every evening | ORAL | Status: DC | PRN

## 2016-10-18 MED ORDER — ALPRAZOLAM 0.25 MG PO TABS
0.1250 mg | ORAL_TABLET | Freq: Three times a day (TID) | ORAL | Status: DC | PRN
  Administered 2016-10-19 – 2016-10-21 (×3): 0.125 mg via ORAL
  Filled 2016-10-18 (×4): qty 1

## 2016-10-18 MED ORDER — MORPHINE SULFATE (PF) 2 MG/ML IV SOLN
1.0000 mg | INTRAVENOUS | Status: DC | PRN
  Administered 2016-10-19 (×2): 1 mg via INTRAVENOUS
  Filled 2016-10-18 (×2): qty 1

## 2016-10-18 MED ORDER — OXYCODONE-ACETAMINOPHEN 5-325 MG PO TABS
1.0000 | ORAL_TABLET | Freq: Four times a day (QID) | ORAL | Status: DC | PRN
  Administered 2016-10-18 – 2016-10-22 (×9): 1 via ORAL
  Filled 2016-10-18 (×9): qty 1

## 2016-10-18 MED ORDER — ONDANSETRON HCL 4 MG PO TABS: 4.0000 mg | ORAL_TABLET | Freq: Four times a day (QID) | ORAL | Status: DC | PRN

## 2016-10-18 NOTE — Telephone Encounter (Signed)
Per chart review tab pt [facility]. Dr Alphonsus SiasLetvak out of office.

## 2016-10-18 NOTE — Telephone Encounter (Signed)
PLEASE NOTE: All timestamps contained within this report are represented as [place] Standard Time. CONFIDENTIALTY NOTICE: This fax transmission is intended only for the addressee. It contains information that is legally privileged, confidential or otherwise protected from use or disclosure. If you are not the intended recipient, you are strictly prohibited from reviewing, disclosing, copying using or disseminating any of this information or taking any action in reliance on or regarding this information. If you have received this fax in error, please notify us immediately by telephone so that we can arrange for its return to us. Phone: [phone], Toll-Free: [phone], Fax: [phone] Page: 1 of 1 Call Id: [id number] [facility] Client TELEPHONE ADVICE RECORD [facility] Patient Name: Kristina Aguirre Gender: Female DOB: 01/25/1927 Age: 83 Y 10 M 4 D Return Phone Number: [phone] (Primary) Address: City/State/Zip: [place] Client [facility] Physician Tillman AbideLetvak, Richard - MD Contact Type Call Who Is Calling Patient / Member / Family / Caregiver Call Type Triage / Clinical Caller Name Etta Grandchildnissa James Relationship To Patient Care Giver Return Phone Number [phone] (Primary) Chief Complaint Hip Injury Reason for Call Symptomatic / Request for Health Information Initial Comment Caller is a nurse reporting her patient was found on the floor and her left elbow and hip are hurting. Needing to speak to a nurse. CBWN: caller has been waiting an hour and the patient is in pain. Translation No No Triage Reason Patient declined Nurse Assessment Nurse: Kemper Durielarke, RN, Lurena Joinerebecca Date/Time Lamount Cohen(Eastern Time): 10/17/2016 8:50:16 PM Confirm and document reason for call. If symptomatic, describe symptoms. ---Caller states pt has been sent to ED for fall after speaking  with pt daughter. Does the patient have any new or worsening symptoms? ---Yes Will a triage be completed? ---No Select reason for no triage. ---Patient declined Guidelines Guideline Title Affirmed Question Affirmed Notes Nurse Date/Time (Eastern Time) Disp. Time Lamount Cohen(Eastern Time) Disposition Final User 10/17/2016 8:22:12 PM Send To Call Back Waiting For Nurse Verlin GrillsBenson, Brittney 10/17/2016 8:51:15 PM Clinical Call Yes Kemper Durielarke, RN, Lurena Joinerebecca

## 2016-10-18 NOTE — Consult Note (Signed)
ORTHOPAEDIC CONSULTATION  PATIENT NAME: Kristina Aguirre DOB: 04/22/1927  MRN: [id number]  REQUESTING PHYSICIAN: Willy EddyPatrick Robinson, MD  Chief Complaint: Left hip pain  HPI: Kristina Dadaancy C Briley is a 83 y.o. female who complains of  severe left hip pain. The patient sustained an unwitnessed fall at 14 legs. She complained of severe left hip pain and was unable stand or bear weight due to the pain. The patient is a poor historian and was unable to further describe the events surrounding the fall. She denied any loss of consciousness. She denied any other injuries.  Past Medical History:  Diagnosis Date  . Arthritis   . Hematuria   . High cholesterol   . Hypertension   . Post herpetic neuralgia    Right side of neck  . Renal disorder   . Shingles   . TIA (transient ischemic attack)   . Vascular dementia    Past Surgical History:  Procedure Laterality Date  . APPENDECTOMY    . COLPORRHAPHY    . TUBAL LIGATION     Social History   Social History  . Marital status: Widowed    Spouse name: N/A  . Number of children: 4  . Years of education: COLLEGE   Social History Main Topics  . Smoking status: Never Smoker  . Smokeless tobacco: Never Used  . Alcohol use 0.6 oz/week    1 Glasses of wine per week     Comment: "I like a glass of wine occasionally"  . Drug use: No  . Sexual activity: Not Asked   Other Topics Concern  . None   Social History Narrative   RIGHT   2   Family History  Problem Relation Age of Onset  . Adopted: Yes   Allergies  Allergen Reactions  . Penicillins Hives   Prior to Admission medications   Medication Sig Start Date End Date Taking? Authorizing Provider  ALPRAZolam Prudy Feeler(XANAX) 0.5 MG tablet Take 0.125 mg by mouth 3 (three) times daily as needed for anxiety.   Yes Historical Provider, MD  amLODipine (NORVASC) 2.5 MG tablet Take 2.5 mg by mouth daily.   Yes Historical Provider, MD  apixaban (ELIQUIS) 2.5 MG TABS tablet Take 1 tablet (2.5 mg total)  by mouth 2 (two) times daily. 08/24/14  Yes Alexa Lucrezia Starch Burns, MD  cetirizine (ZYRTEC) 10 MG tablet Take 10 mg by mouth daily.   Yes Historical Provider, MD  gabapentin (NEURONTIN) 400 MG capsule Take 400 mg by mouth 3 (three) times daily.    Yes Historical Provider, MD  Lidocaine (ASPERCREME LIDOCAINE) 4 % PTCH Apply 1 patch topically every morning.   Yes Historical Provider, MD  oxyCODONE-acetaminophen (PERCOCET/ROXICET) 5-325 MG tablet Take 1 tablet by mouth every 6 (six) hours as needed for severe pain. 03/06/16  Yes Anne-Caroline Sharma CovertNorman, MD  simvastatin (ZOCOR) 20 MG tablet Take 20 mg by mouth at bedtime.    Yes Historical Provider, MD  traMADol (ULTRAM) 50 MG tablet Take 50-100 mg by mouth 3 (three) times daily. Take 1 tablet (50 mg) every morning and at bedtime, take 2 tablets (100 mg) daily at 3pm   Yes Historical Provider, MD  OVER THE COUNTER MEDICATION Place 1 drop into both eyes at bedtime. Over the counter eye drops for dry eyes    Historical Provider, MD   Dg Chest 1 View  Result Date: 10/17/2016 CLINICAL DATA:  Preop left hip fracture EXAM: CHEST 1 VIEW COMPARISON:  03/05/2016 FINDINGS: The patient's head and chin obscure the  lung apices. The right lung is grossly clear. Streaky opacities at the left base probably reflect atelectasis. No gross consolidation or effusion. Stable cardiomegaly. Diffuse osteopenia. IMPRESSION: Upper lung zones are obscured by the patient's head and chin. Streaky atelectasis at the left base.  Stable cardiomegaly. Electronically Signed   By: Jasmine Pang M.D.   On: 10/17/2016 22:22   Ct Head Wo Contrast  Result Date: 10/17/2016 CLINICAL DATA:  Unwitnessed fall.  Patient may have hit head. EXAM: CT HEAD WITHOUT CONTRAST CT CERVICAL SPINE WITHOUT CONTRAST TECHNIQUE: Multidetector CT imaging of the head and cervical spine was performed following the standard protocol without intravenous contrast. Multiplanar CT image reconstructions of the cervical spine were also  generated. COMPARISON:  None. FINDINGS: CT HEAD FINDINGS Brain: Moderate cerebral atrophy with diffuse chronic small vessel white matter changes are present bilaterally. No acute intracranial hemorrhage, midline shift or edema. No intra-axial mass nor extra-axial fluid collections. Vascular: Carotid siphon calcifications are again noted. No hyperdense vessels. Skull: Nonacute Sinuses/Orbits: Nonacute Other: None CT CERVICAL SPINE FINDINGS Alignment: Reversal cervical lordosis again noted. 3 mm anterolisthesis of C4 on C5 is also unchanged. Skull base and vertebrae: No acute fracture bone destruction. Soft tissues and spinal canal: No prevertebral soft tissue swelling nor intraspinal hemorrhage. Disc levels: Disc space narrowing at C4-5 and C5-6 as before. Multilevel degenerative facet ankylosis from C2 through C7. No significant neural foraminal encroachment. Upper chest: Nonacute Other: None IMPRESSION: Cerebral atrophy with chronic moderate small vessel ischemic disease of periventricular white matter. No acute intracranial abnormality. Reversal cervical lordosis appears chronic with minimal anterolisthesis of C4 on C5 as before. No acute osseous abnormality. Cervical spondylosis. Bilateral facet ankylosis. Electronically Signed   By: Tollie Eth M.D.   On: 10/17/2016 22:41   Ct Cervical Spine Wo Contrast  Result Date: 10/17/2016 CLINICAL DATA:  Unwitnessed fall.  Patient may have hit head. EXAM: CT HEAD WITHOUT CONTRAST CT CERVICAL SPINE WITHOUT CONTRAST TECHNIQUE: Multidetector CT imaging of the head and cervical spine was performed following the standard protocol without intravenous contrast. Multiplanar CT image reconstructions of the cervical spine were also generated. COMPARISON:  None. FINDINGS: CT HEAD FINDINGS Brain: Moderate cerebral atrophy with diffuse chronic small vessel white matter changes are present bilaterally. No acute intracranial hemorrhage, midline shift or edema. No intra-axial mass nor  extra-axial fluid collections. Vascular: Carotid siphon calcifications are again noted. No hyperdense vessels. Skull: Nonacute Sinuses/Orbits: Nonacute Other: None CT CERVICAL SPINE FINDINGS Alignment: Reversal cervical lordosis again noted. 3 mm anterolisthesis of C4 on C5 is also unchanged. Skull base and vertebrae: No acute fracture bone destruction. Soft tissues and spinal canal: No prevertebral soft tissue swelling nor intraspinal hemorrhage. Disc levels: Disc space narrowing at C4-5 and C5-6 as before. Multilevel degenerative facet ankylosis from C2 through C7. No significant neural foraminal encroachment. Upper chest: Nonacute Other: None IMPRESSION: Cerebral atrophy with chronic moderate small vessel ischemic disease of periventricular white matter. No acute intracranial abnormality. Reversal cervical lordosis appears chronic with minimal anterolisthesis of C4 on C5 as before. No acute osseous abnormality. Cervical spondylosis. Bilateral facet ankylosis. Electronically Signed   By: Tollie Eth M.D.   On: 10/17/2016 22:41   Dg Hip Unilat W Or Wo Pelvis 2-3 Views Left  Result Date: 10/17/2016 CLINICAL DATA:  Unwitnessed fall tonight. EXAM: DG HIP (WITH OR WITHOUT PELVIS) 2-3V LEFT COMPARISON:  None. FINDINGS: There is an intertrochanteric left hip fracture with longitudinal component extending into the proximal diaphysis. There is varus angulation. No dislocation.  No radiographic findings to suggest a pathologic basis for the fracture. Bony pelvis is intact. IMPRESSION: Intertrochanteric left hip fracture Electronically Signed   By: Ellery Plunk M.D.   On: 10/17/2016 22:15    Positive ROS: All other systems have been reviewed and were otherwise negative with the exception of those mentioned in the HPI and as above.  Physical Exam: General: Elderly, frail appearing female. Awake and alert in no acute distress. HEENT: Atraumatic and normocephalic. Sclera are clear. Extraocular motion is intact.  Oropharynx is clear with moist mucosa. Neck: Antcollis with limited range of motion. No JVD or carotid bruits. Lungs: Clear to auscultation bilaterally. Cardiovascular: Regular rate and rhythm with normal S1 and S2. No murmurs. No gallops or rubs. Pedal pulses are palpable bilaterally. Homans test is negative bilaterally. No significant pretibial or ankle edema. Abdomen: Soft, nontender, and nondistended. Bowel sounds are present. Skin: A superficial skin tears noted to the left elbow Neurologic: Awake, alert, and oriented. Sensory function is grossly intact. Motor strength is felt to be 5 over 5 bilaterally. No clonus or tremor.  Lymphatic: No axillary or cervical lymphadenopathy  MUSCULOSKELETAL:  Examination of the upper extremities is notable for degenerative changes to the left wrist and hand with apparent contractures of the digits. Examination of the left lower extremity shows extremity be shortened and rotated. Pain is elicited with attempts at range of motion of the left hip. No tenderness to palpation about the left knee or ankle. No knee effusion. No significant pretibial or ankle edema.  Assessment: Left intertrochanteric femur fracture  Plan: The findings were discussed in detail with the patient and her daughter. Recommendations were made for open reduction and internal fixation of the left intertrochanteric femur fracture. The usual perioperative course was discussed. The risks and benefits of surgical intervention were reviewed. The patient and her daughter expressed understanding of the risks and benefits and agreed with plans for surgical intervention.   The surgical site was signed as per the "right site surgery" protocol.   I would anticipate the need to delay surgical intervention for 48 hours due to the patient's anticoagulation with [place]os. I will also request an Anesthesiology consult given the patient's limited neck range of motion and relative contraindication to  regional anesthesia.  Linkoln Alkire P. Angie Fava M.D.

## 2016-10-18 NOTE — Progress Notes (Signed)
Subjective :Patient is day # 1 left intertrochanteric femur fracture Patient  not able to communicate pain level.   Difficulty time getting patient aroused enough to even say good morning Does not appear to have any evidence of any nausea or vomiting    Objective: Vital signs in last 24 hours: Temp:  [97.4 F (36.3 C)-97.6 F (36.4 C)] 97.6 F (36.4 C) (01/05 0423) Pulse Rate:  [47-68] 61 (01/05 0423) Resp:  [14-18] 18 (01/05 0423) BP: (102-148)/(57-81) 148/65 (01/05 0423) SpO2:  [89 %-96 %] 92 % (01/05 0423) Weight:  [49 kg (108 lb)-50.1 kg (110 lb 8 oz)] 50.1 kg (110 lb 8 oz) (01/05 0419) Bucks traction in place No tissue breakdown noted  Intake/Output from previous day: 01/04 0701 - 01/05 0700 In: 100 [I.V.:50; IV Piggyback:50] Out: -  Intake/Output this shift: No intake/output data recorded.   Recent Labs  10/17/16 2156 10/18/16 0444  HGB 12.5 12.5    Recent Labs  10/17/16 2156 10/18/16 0444  WBC 15.7* 14.8*  RBC 4.00 3.93  HCT 36.8 36.3  PLT 343 332    Recent Labs  10/17/16 2156 10/18/16 0444  NA 136 137  K 4.1 4.6  CL 102 105  CO2 25 24  BUN 52* 51*  CREATININE 1.97* 1.97*  GLUCOSE 152* 114*  CALCIUM 8.4* 8.7*    Recent Labs  10/17/16 2156 10/18/16 0444  INR 1.67 1.59    Neurologically intact Neurovascular intact  Assessment/Plan: Eliquis on hold. Plan possibly do surgery tomorrow Patient placed on regular diet. Nothing by mouth after midnight tonight Case management to assist with discharge planning Labs in am Plan to discharge possibly Monday [facility] We'll need to continue Eliquis postop Staples are to be removed 2 weeks postop Change dressing as needed   Zigmund Linse R. 10/18/2016, 7:55 AM

## 2016-10-18 NOTE — Telephone Encounter (Signed)
noted 

## 2016-10-18 NOTE — Progress Notes (Addendum)
Per Sue LushAndrea admissions coordinator at [facility] patient is a [facility] care [facility] and can return when stable.   Baker Hughes IncorporatedBailey Jisele Price, LCSW [phone]

## 2016-10-18 NOTE — Progress Notes (Signed)
Pt admitted with left hip fracture. Consult was called to Dr Ernest PineHooten from ED. He will do surgery later because pt is taking eliquis. Pt is alert and oriented was able to answer questions during admission process. Pt has a foley and 5# bucks. Has hx of vascular dementia. [facility] is [facility]. MRSA PCR is negative

## 2016-10-18 NOTE — NC FL2 (Signed)
MEDICAID FL2 LEVEL OF CARE SCREENING TOOL     IDENTIFICATION  Patient Name: Kristina Aguirre Birthdate: 02/11/1927 Sex: female Admission Date (Current Location): 10/17/2016  [facility] [place]   Facility and Address:  [facility], [address], [place], [place] [address]      Provider Number: [id number]  Attending Physician Name and Address:  Enedina FinnerSona Patel, MD  Relative Name and Phone Number:       Current Level of Care: Hospital Recommended Level of Care: Skilled Nursing Facility Prior Approval Number:    Date Approved/Denied:   PASRR Number:    Discharge Plan: SNF    Current Diagnoses: Patient Active Problem List   Diagnosis Date Noted  . History of kidney stones 10/18/2016  . H/O migraine 10/18/2016  . Hip fracture (HCC) 10/18/2016  . Vascular dementia with behavior disturbance 01/03/2016  . CVA (cerebrovascular accident) (HCC) 11/08/2014  . Acute embolic stroke (HCC) 11/08/2014  . Paroxysmal atrial fibrillation (HCC) 08/23/2014  . Essential hypertension 08/21/2014  . Postherpetic neuralgia 08/21/2014  . HLD (hyperlipidemia) 08/21/2014    Orientation RESPIRATION BLADDER Height & Weight     Self, Time, Situation, Place  Normal Continent Weight: 110 lb 8 oz (50.1 kg) Height:  5\' 3"  (160 cm)  BEHAVIORAL SYMPTOMS/MOOD NEUROLOGICAL BOWEL NUTRITION STATUS   (none)  (none) Continent Diet (Regular Diet )  AMBULATORY STATUS COMMUNICATION OF NEEDS Skin   Extensive Assist Verbally Surgical wounds                       Personal Care Assistance Level of Assistance  Bathing, Feeding, Dressing Bathing Assistance: Limited assistance Feeding assistance: Independent Dressing Assistance: Limited assistance     Functional Limitations Info  Sight, Hearing, Speech Sight Info: Adequate Hearing Info: Adequate Speech Info: Adequate    SPECIAL CARE FACTORS FREQUENCY  PT (By licensed PT), OT (By licensed OT)      PT Frequency:  (5) OT Frequency:  (5)            Contractures      Additional Factors Info  Code Status, Allergies Code Status Info:  (DNR ) Allergies Info:  (Penicillins )           Current Medications (10/18/2016):  This is the current hospital active medication list Current Facility-Administered Medications  Medication Dose Route Frequency Provider Last Rate Last Dose  . 0.9 %  sodium chloride infusion   Intravenous Continuous Ihor AustinPavan Pyreddy, MD 75 mL/hr at 10/18/16 0418    . acetaminophen (OFIRMEV) IV 1,000 mg  1,000 mg Intravenous Q6H Willy EddyPatrick Robinson, MD   Stopped at 10/17/16 2355  . acetaminophen (TYLENOL) tablet 650 mg  650 mg Oral Q6H PRN Ihor AustinPavan Pyreddy, MD       Or  . acetaminophen (TYLENOL) suppository 650 mg  650 mg Rectal Q6H PRN Ihor AustinPavan Pyreddy, MD      . ALPRAZolam Prudy Feeler(XANAX) tablet 0.125 mg  0.125 mg Oral TID PRN Ihor AustinPavan Pyreddy, MD      . amLODipine (NORVASC) tablet 2.5 mg  2.5 mg Oral Daily Ihor AustinPavan Pyreddy, MD   2.5 mg at 10/18/16 0923  . clindamycin (CLEOCIN) IVPB 600 mg  600 mg Intravenous To OR Donato HeinzJames P Hooten, MD      . fentaNYL (SUBLIMAZE) injection 25 mcg  25 mcg Intravenous Q1H PRN Willy EddyPatrick Robinson, MD   25 mcg at 10/17/16 2340  . gabapentin (NEURONTIN) capsule 400 mg  400 mg Oral TID Vivien RotaPavan  Pyreddy, MD   400 mg at 10/18/16 0923  . loratadine (CLARITIN) tablet 10 mg  10 mg Oral Daily Ihor Austin, MD   10 mg at 10/18/16 0923  . morphine 2 MG/ML injection 1 mg  1 mg Intravenous Q4H PRN Pavan Pyreddy, MD      . ondansetron (ZOFRAN) tablet 4 mg  4 mg Oral Q6H PRN Ihor Austin, MD       Or  . ondansetron (ZOFRAN) injection 4 mg  4 mg Intravenous Q6H PRN Pavan Pyreddy, MD      . oxyCODONE-acetaminophen (PERCOCET/ROXICET) 5-325 MG per tablet 1 tablet  1 tablet Oral Q6H PRN Ihor Austin, MD   1 tablet at 10/18/16 0519  . senna-docusate (Senokot-S) tablet 1 tablet  1 tablet Oral QHS PRN Ihor Austin, MD      . simvastatin (ZOCOR) tablet 20 mg  20 mg Oral QHS Pavan  Pyreddy, MD      . sodium chloride flush (NS) 0.9 % injection 3 mL  3 mL Intravenous Q12H Ihor Austin, MD         Discharge Medications: Please see discharge summary for a list of discharge medications.  Relevant Imaging Results:  Relevant Lab Results:   Additional Information  (SSN: [id number])  Enisa Runyan, Darleen Crocker, LCSW

## 2016-10-18 NOTE — H&P (Signed)
[facility] - [place] at [facility]   PATIENT NAME: Kristina Aguirre    MR#:  [id number]  DATE OF BIRTH:  1927/07/20  DATE OF ADMISSION:  10/17/2016  PRIMARY CARE PHYSICIAN: Tillman Abide, MD   REQUESTING/REFERRING PHYSICIAN:   CHIEF COMPLAINT:   Chief Complaint  Patient presents with  . Fall  . Hip Pain    HISTORY OF PRESENT ILLNESS: Kristina Aguirre  is a 83 y.o. female with a known history of Arthritis, hypertension, hyperlipidemia, transient ischemic attack, vascular dementia, atrial fibrillation from nursing home facility on oral eliquis for anticoagulation had sustained a fall. Patient lost balance and fell and landed on her left hip. Patient has tenderness in the left hip area. She received Percocet and tramadol for pain and had mild hypoxia during the presentation. Patient was worked up with a x-ray of the pelvis which showed intertrochanteric left hip fracture. No complaints of any chest pain. No fever, chills and cough. No history of any head injury as per nursing home documentation and transfer note. Case was discussed with orthopedic service by ER physician and hospitalist service was consulted for further care of the patient. Patient is awake and responds to verbal commands. She is DO NOT RESUSCITATE by CODE STATUS.  PAST MEDICAL HISTORY:   Past Medical History:  Diagnosis Date  . Arthritis   . Hematuria   . High cholesterol   . Hypertension   . Post herpetic neuralgia    Right side of neck  . Renal disorder   . Shingles   . TIA (transient ischemic attack)   . Vascular dementia     PAST SURGICAL HISTORY: Past Surgical History:  Procedure Laterality Date  . APPENDECTOMY    . COLPORRHAPHY    . TUBAL LIGATION      SOCIAL HISTORY:  Social History  Substance Use Topics  . Smoking status: Never Smoker  . Smokeless tobacco: Never Used  . Alcohol use 0.6 oz/week    1 Glasses of wine per week     Comment: "I like a glass of wine  occasionally"    FAMILY HISTORY:  Family History  Problem Relation Age of Onset  . Adopted: Yes    DRUG ALLERGIES:  Allergies  Allergen Reactions  . Penicillins Hives    REVIEW OF SYSTEMS:   CONSTITUTIONAL: No fever, fatigue or weakness.  EYES: No blurred or double vision.  EARS, NOSE, AND THROAT: No tinnitus or ear pain.  RESPIRATORY: No cough, shortness of breath, wheezing or hemoptysis.  CARDIOVASCULAR: No chest pain, orthopnea, edema.  GASTROINTESTINAL: No nausea, vomiting, diarrhea or abdominal pain.  GENITOURINARY: No dysuria, hematuria.  ENDOCRINE: No polyuria, nocturia,  HEMATOLOGY: No anemia, easy bruising or bleeding SKIN: Abrasion left elbow area MUSCULOSKELETAL: Has left hip pain  NEUROLOGIC: No tingling, numbness, weakness.  PSYCHIATRY: No anxiety or depression.   MEDICATIONS AT HOME:  Prior to Admission medications   Medication Sig Start Date End Date Taking? Authorizing Provider  ALPRAZolam Prudy Feeler) 0.5 MG tablet Take 0.125 mg by mouth 3 (three) times daily as needed for anxiety.   Yes Historical Provider, MD  amLODipine (NORVASC) 2.5 MG tablet Take 2.5 mg by mouth daily.   Yes Historical Provider, MD  apixaban (ELIQUIS) 2.5 MG TABS tablet Take 1 tablet (2.5 mg total) by mouth 2 (two) times daily. 08/24/14  Yes Alexa Lucrezia Starch, MD  cetirizine (ZYRTEC) 10 MG tablet Take 10 mg by mouth daily.   Yes Historical Provider, MD  gabapentin (NEURONTIN) 400  MG capsule Take 400 mg by mouth 3 (three) times daily.    Yes Historical Provider, MD  Lidocaine (ASPERCREME LIDOCAINE) 4 % PTCH Apply 1 patch topically every morning.   Yes Historical Provider, MD  oxyCODONE-acetaminophen (PERCOCET/ROXICET) 5-325 MG tablet Take 1 tablet by mouth every 6 (six) hours as needed for severe pain. 03/06/16  Yes Anne-Caroline Sharma Covert, MD  simvastatin (ZOCOR) 20 MG tablet Take 20 mg by mouth at bedtime.    Yes Historical Provider, MD  traMADol (ULTRAM) 50 MG tablet Take 50-100 mg by mouth 3  (three) times daily. Take 1 tablet (50 mg) every morning and at bedtime, take 2 tablets (100 mg) daily at 3pm   Yes Historical Provider, MD  OVER THE COUNTER MEDICATION Place 1 drop into both eyes at bedtime. Over the counter eye drops for dry eyes    Historical Provider, MD      PHYSICAL EXAMINATION:   VITAL SIGNS: Blood pressure 122/81, pulse (!) 59, temperature 97.4 F (36.3 C), temperature source Oral, resp. rate 18, height 5' (1.524 m), weight 49 kg (108 lb), SpO2 96 %.  GENERAL:  83 y.o.-year-old elderly female patient lying in the bed with no acute distress.  EYES: Pupils equal, round, reactive to light and accommodation. No scleral icterus. Extraocular muscles intact.  HEENT: Head atraumatic, normocephalic. Oropharynx and nasopharynx clear.  NECK:  Supple, no jugular venous distention. No thyroid enlargement, no tenderness.  LUNGS: Normal breath sounds bilaterally, no wheezing, rales,rhonchi or crepitation. No use of accessory muscles of respiration.  CARDIOVASCULAR: S1, S2 irregular. No murmurs, rubs, or gallops.  ABDOMEN: Soft, nontender, nondistended. Bowel sounds present. No organomegaly or mass.  EXTREMITIES: No pedal edema, cyanosis, or clubbing. Tenderness left hip area  NEUROLOGIC: Cranial nerves II through XII are intact. Muscle strength 5/5 in all extremities. Sensation intact. Gait not checked.  PSYCHIATRIC: The patient is alert and oriented x 3.  SKIN: No obvious rash, lesion, or ulcer.   LABORATORY PANEL:   CBC  Recent Labs Lab 10/17/16 2156  WBC 15.7*  HGB 12.5  HCT 36.8  PLT 343  MCV 91.8  MCH 31.2  MCHC 34.0  RDW 13.6   ------------------------------------------------------------------------------------------------------------------  Chemistries   Recent Labs Lab 10/17/16 2156  NA 136  K 4.1  CL 102  CO2 25  GLUCOSE 152*  BUN 52*  CREATININE 1.97*  CALCIUM 8.4*    ------------------------------------------------------------------------------------------------------------------ estimated creatinine clearance is 14.5 mL/min (by C-G formula based on SCr of 1.97 mg/dL (H)). ------------------------------------------------------------------------------------------------------------------ No results for input(s): TSH, T4TOTAL, T3FREE, THYROIDAB in the last 72 hours.  Invalid input(s): FREET3   Coagulation profile  Recent Labs Lab 10/17/16 2156  INR 1.67   ------------------------------------------------------------------------------------------------------------------- No results for input(s): DDIMER in the last 72 hours. -------------------------------------------------------------------------------------------------------------------  Cardiac Enzymes  Recent Labs Lab 10/17/16 2156  TROPONINI <0.03   ------------------------------------------------------------------------------------------------------------------ Invalid input(s): POCBNP  ---------------------------------------------------------------------------------------------------------------  Urinalysis    Component Value Date/Time   COLORURINE YELLOW 08/21/2014 1354   APPEARANCEUR CLEAR 08/21/2014 1354   LABSPEC 1.008 08/21/2014 1354   PHURINE 6.5 08/21/2014 1354   GLUCOSEU NEGATIVE 08/21/2014 1354   HGBUR MODERATE (A) 08/21/2014 1354   BILIRUBINUR NEGATIVE 08/21/2014 1354   KETONESUR NEGATIVE 08/21/2014 1354   PROTEINUR NEGATIVE 08/21/2014 1354   UROBILINOGEN 0.2 08/21/2014 1354   NITRITE NEGATIVE 08/21/2014 1354   LEUKOCYTESUR TRACE (A) 08/21/2014 1354     RADIOLOGY: Dg Chest 1 View  Result Date: 10/17/2016 CLINICAL DATA:  Preop left hip fracture EXAM: CHEST 1 VIEW COMPARISON:  03/05/2016 FINDINGS: The patient's head and chin obscure the lung apices. The right lung is grossly clear. Streaky opacities at the left base probably reflect atelectasis. No gross  consolidation or effusion. Stable cardiomegaly. Diffuse osteopenia. IMPRESSION: Upper lung zones are obscured by the patient's head and chin. Streaky atelectasis at the left base.  Stable cardiomegaly. Electronically Signed   By: Jasmine PangKim  Fujinaga M.D.   On: 10/17/2016 22:22   Ct Head Wo Contrast  Result Date: 10/17/2016 CLINICAL DATA:  Unwitnessed fall.  Patient may have hit head. EXAM: CT HEAD WITHOUT CONTRAST CT CERVICAL SPINE WITHOUT CONTRAST TECHNIQUE: Multidetector CT imaging of the head and cervical spine was performed following the standard protocol without intravenous contrast. Multiplanar CT image reconstructions of the cervical spine were also generated. COMPARISON:  None. FINDINGS: CT HEAD FINDINGS Brain: Moderate cerebral atrophy with diffuse chronic small vessel white matter changes are present bilaterally. No acute intracranial hemorrhage, midline shift or edema. No intra-axial mass nor extra-axial fluid collections. Vascular: Carotid siphon calcifications are again noted. No hyperdense vessels. Skull: Nonacute Sinuses/Orbits: Nonacute Other: None CT CERVICAL SPINE FINDINGS Alignment: Reversal cervical lordosis again noted. 3 mm anterolisthesis of C4 on C5 is also unchanged. Skull base and vertebrae: No acute fracture bone destruction. Soft tissues and spinal canal: No prevertebral soft tissue swelling nor intraspinal hemorrhage. Disc levels: Disc space narrowing at C4-5 and C5-6 as before. Multilevel degenerative facet ankylosis from C2 through C7. No significant neural foraminal encroachment. Upper chest: Nonacute Other: None IMPRESSION: Cerebral atrophy with chronic moderate small vessel ischemic disease of periventricular white matter. No acute intracranial abnormality. Reversal cervical lordosis appears chronic with minimal anterolisthesis of C4 on C5 as before. No acute osseous abnormality. Cervical spondylosis. Bilateral facet ankylosis. Electronically Signed   By: Tollie Ethavid  Kwon M.D.   On:  10/17/2016 22:41   Ct Cervical Spine Wo Contrast  Result Date: 10/17/2016 CLINICAL DATA:  Unwitnessed fall.  Patient may have hit head. EXAM: CT HEAD WITHOUT CONTRAST CT CERVICAL SPINE WITHOUT CONTRAST TECHNIQUE: Multidetector CT imaging of the head and cervical spine was performed following the standard protocol without intravenous contrast. Multiplanar CT image reconstructions of the cervical spine were also generated. COMPARISON:  None. FINDINGS: CT HEAD FINDINGS Brain: Moderate cerebral atrophy with diffuse chronic small vessel white matter changes are present bilaterally. No acute intracranial hemorrhage, midline shift or edema. No intra-axial mass nor extra-axial fluid collections. Vascular: Carotid siphon calcifications are again noted. No hyperdense vessels. Skull: Nonacute Sinuses/Orbits: Nonacute Other: None CT CERVICAL SPINE FINDINGS Alignment: Reversal cervical lordosis again noted. 3 mm anterolisthesis of C4 on C5 is also unchanged. Skull base and vertebrae: No acute fracture bone destruction. Soft tissues and spinal canal: No prevertebral soft tissue swelling nor intraspinal hemorrhage. Disc levels: Disc space narrowing at C4-5 and C5-6 as before. Multilevel degenerative facet ankylosis from C2 through C7. No significant neural foraminal encroachment. Upper chest: Nonacute Other: None IMPRESSION: Cerebral atrophy with chronic moderate small vessel ischemic disease of periventricular white matter. No acute intracranial abnormality. Reversal cervical lordosis appears chronic with minimal anterolisthesis of C4 on C5 as before. No acute osseous abnormality. Cervical spondylosis. Bilateral facet ankylosis. Electronically Signed   By: Tollie Ethavid  Kwon M.D.   On: 10/17/2016 22:41   Dg Hip Unilat W Or Wo Pelvis 2-3 Views Left  Result Date: 10/17/2016 CLINICAL DATA:  Unwitnessed fall tonight. EXAM: DG HIP (WITH OR WITHOUT PELVIS) 2-3V LEFT COMPARISON:  None. FINDINGS: There is an intertrochanteric left hip  fracture with longitudinal component extending  into the proximal diaphysis. There is varus angulation. No dislocation. No radiographic findings to suggest a pathologic basis for the fracture. Bony pelvis is intact. IMPRESSION: Intertrochanteric left hip fracture Electronically Signed   By: Ellery Plunk M.D.   On: 10/17/2016 22:15    EKG: Orders placed or performed during the hospital encounter of 10/17/16  . ED EKG  . ED EKG  . ED EKG  . ED EKG  . EKG 12-Lead  . EKG 12-Lead  . EKG 12-Lead  . EKG 12-Lead    IMPRESSION AND PLAN: 83 year old elderly female patient with history of arthritis, dementia, TIA, hypertension, hyperlipidemia presented to the emergency room for fall from the nursing home. Admitting diagnosis 1. Intertrochanteric fracture left hip 2. Accidental fall 3. Hypertension 4. Hyperlipidemia 5. Arthritis Treatment plan Admit patient to inpatient service Orthopedic surgery consultation Hold eliquis DVT prophylaxis with sequential compression devices to lower extremities Pain management Supportive care. All the records are reviewed and case discussed with ED provider. Management plans discussed with the patient, family and they are in agreement.  CODE STATUS:DNR Code Status History    Date Active Date Inactive Code Status Order ID Comments User Context   08/21/2014  4:33 PM 08/24/2014  2:32 PM Full Code [id number]  Courtney Paris, MD Inpatient    Advance Directive Documentation   Flowsheet Row Most Recent Value  Type of Advance Directive  Out of facility DNR (pink MOST or yellow form)  Pre-existing out of facility DNR order (yellow form or pink MOST form)  Yellow form placed in chart (order not valid for inpatient use)  "MOST" Form in Place?  No data       TOTAL TIME TAKING CARE OF THIS PATIENT: 50 minutes.    Ihor Austin M.D on 10/18/2016 at 2:15 AM  Between 7am to 6pm - Pager - [phone]  After 6pm go to [email or web address] - password [email or web address] [facility]  [facility]  Office  [phone]  CC: Primary care physician; Tillman Abide, MD

## 2016-10-18 NOTE — Progress Notes (Addendum)
[facility] -  at [facility]   PATIENT NAME: Kristina Aguirre    MR#:  [id number]  DATE OF BIRTH:  [birth year]  SUBJECTIVE:  Came in after having mechanical fall C/o left hip pain  REVIEW OF SYSTEMS:   Review of Systems  Constitutional: Negative for chills, fever and weight loss.  HENT: Negative for ear discharge, ear pain and nosebleeds.   Eyes: Negative for blurred vision, pain and discharge.  Respiratory: Negative for sputum production, shortness of breath, wheezing and stridor.   Cardiovascular: Negative for chest pain, palpitations, orthopnea and PND.  Gastrointestinal: Negative for abdominal pain, diarrhea, nausea and vomiting.  Genitourinary: Negative for frequency and urgency.  Musculoskeletal: Positive for falls and joint pain. Negative for back pain.  Neurological: Positive for weakness. Negative for sensory change, speech change and focal weakness.  Psychiatric/Behavioral: Negative for depression and hallucinations. The patient is not nervous/anxious.    Tolerating Diet:yes ,some Tolerating PT: pending  DRUG ALLERGIES:   Allergies  Allergen Reactions  . Penicillins Hives    VITALS:  Blood pressure 131/61, pulse (!) 59, temperature 98.9 F (37.2 C), temperature source Oral, resp. rate 18, height 5\' 3"  (1.6 m), weight 50.1 kg (110 lb 8 oz), SpO2 96 %.  PHYSICAL EXAMINATION:   Physical Exam  GENERAL:  83 y.o.-year-old patient lying in the bed with no acute distress.  EYES: Pupils equal, round, reactive to light and accommodation. No scleral icterus. Extraocular muscles intact.  HEENT: Head atraumatic, normocephalic. Oropharynx and nasopharynx clear.  NECK:  Supple, no jugular venous distention. No thyroid enlargement, no tenderness.  LUNGS: Normal breath sounds bilaterally, no wheezing, rales, rhonchi. No use of accessory muscles of respiration.  CARDIOVASCULAR: S1, S2 normal. No murmurs, rubs, or gallops.  ABDOMEN: Soft, nontender,  nondistended. Bowel sounds present. No organomegaly or mass.  EXTREMITIES: No cyanosis, clubbing or edema b/l.    NEUROLOGIC: Cranial nerves II through XII are intact. No focal Motor or sensory deficits b/l.   PSYCHIATRIC:  patient is alert and oriented x 3.  SKIN: No obvious rash, lesion, or ulcer.   LABORATORY PANEL:  CBC  Recent Labs Lab 10/18/16 0444  WBC 14.8*  HGB 12.5  HCT 36.3  PLT 332    Chemistries   Recent Labs Lab 10/18/16 0444  NA 137  K 4.6  CL 105  CO2 24  GLUCOSE 114*  BUN 51*  CREATININE 1.97*  CALCIUM 8.7*   Cardiac Enzymes  Recent Labs Lab 10/17/16 2156  TROPONINI <0.03   RADIOLOGY:  Dg Chest 1 View  Result Date: 10/17/2016 CLINICAL DATA:  Preop left hip fracture EXAM: CHEST 1 VIEW COMPARISON:  03/05/2016 FINDINGS: The patient's head and chin obscure the lung apices. The right lung is grossly clear. Streaky opacities at the left base probably reflect atelectasis. No gross consolidation or effusion. Stable cardiomegaly. Diffuse osteopenia. IMPRESSION: Upper lung zones are obscured by the patient's head and chin. Streaky atelectasis at the left base.  Stable cardiomegaly. Electronically Signed   By: Jasmine Pang M.D.   On: 10/17/2016 22:22   Ct Head Wo Contrast  Result Date: 10/17/2016 CLINICAL DATA:  Unwitnessed fall.  Patient may have hit head. EXAM: CT HEAD WITHOUT CONTRAST CT CERVICAL SPINE WITHOUT CONTRAST TECHNIQUE: Multidetector CT imaging of the head and cervical spine was performed following the standard protocol without intravenous contrast. Multiplanar CT image reconstructions of the cervical spine were also generated. COMPARISON:  None. FINDINGS: CT HEAD FINDINGS Brain: Moderate cerebral atrophy with diffuse  chronic small vessel white matter changes are present bilaterally. No acute intracranial hemorrhage, midline shift or edema. No intra-axial mass nor extra-axial fluid collections. Vascular: Carotid siphon calcifications are again noted. No  hyperdense vessels. Skull: Nonacute Sinuses/Orbits: Nonacute Other: None CT CERVICAL SPINE FINDINGS Alignment: Reversal cervical lordosis again noted. 3 mm anterolisthesis of C4 on C5 is also unchanged. Skull base and vertebrae: No acute fracture bone destruction. Soft tissues and spinal canal: No prevertebral soft tissue swelling nor intraspinal hemorrhage. Disc levels: Disc space narrowing at C4-5 and C5-6 as before. Multilevel degenerative facet ankylosis from C2 through C7. No significant neural foraminal encroachment. Upper chest: Nonacute Other: None IMPRESSION: Cerebral atrophy with chronic moderate small vessel ischemic disease of periventricular white matter. No acute intracranial abnormality. Reversal cervical lordosis appears chronic with minimal anterolisthesis of C4 on C5 as before. No acute osseous abnormality. Cervical spondylosis. Bilateral facet ankylosis. Electronically Signed   By: Tollie Ethavid  Kwon M.D.   On: 10/17/2016 22:41   Ct Cervical Spine Wo Contrast  Result Date: 10/17/2016 CLINICAL DATA:  Unwitnessed fall.  Patient may have hit head. EXAM: CT HEAD WITHOUT CONTRAST CT CERVICAL SPINE WITHOUT CONTRAST TECHNIQUE: Multidetector CT imaging of the head and cervical spine was performed following the standard protocol without intravenous contrast. Multiplanar CT image reconstructions of the cervical spine were also generated. COMPARISON:  None. FINDINGS: CT HEAD FINDINGS Brain: Moderate cerebral atrophy with diffuse chronic small vessel white matter changes are present bilaterally. No acute intracranial hemorrhage, midline shift or edema. No intra-axial mass nor extra-axial fluid collections. Vascular: Carotid siphon calcifications are again noted. No hyperdense vessels. Skull: Nonacute Sinuses/Orbits: Nonacute Other: None CT CERVICAL SPINE FINDINGS Alignment: Reversal cervical lordosis again noted. 3 mm anterolisthesis of C4 on C5 is also unchanged. Skull base and vertebrae: No acute fracture bone  destruction. Soft tissues and spinal canal: No prevertebral soft tissue swelling nor intraspinal hemorrhage. Disc levels: Disc space narrowing at C4-5 and C5-6 as before. Multilevel degenerative facet ankylosis from C2 through C7. No significant neural foraminal encroachment. Upper chest: Nonacute Other: None IMPRESSION: Cerebral atrophy with chronic moderate small vessel ischemic disease of periventricular white matter. No acute intracranial abnormality. Reversal cervical lordosis appears chronic with minimal anterolisthesis of C4 on C5 as before. No acute osseous abnormality. Cervical spondylosis. Bilateral facet ankylosis. Electronically Signed   By: Tollie Ethavid  Kwon M.D.   On: 10/17/2016 22:41   Dg Hip Unilat W Or Wo Pelvis 2-3 Views Left  Result Date: 10/17/2016 CLINICAL DATA:  Unwitnessed fall tonight. EXAM: DG HIP (WITH OR WITHOUT PELVIS) 2-3V LEFT COMPARISON:  None. FINDINGS: There is an intertrochanteric left hip fracture with longitudinal component extending into the proximal diaphysis. There is varus angulation. No dislocation. No radiographic findings to suggest a pathologic basis for the fracture. Bony pelvis is intact. IMPRESSION: Intertrochanteric left hip fracture Electronically Signed   By: Ellery Plunkaniel R Mitchell M.D.   On: 10/17/2016 22:15   ASSESSMENT AND PLAN:  Tanda Rockersancy Drew  is a 83 y.o. female with a known history of Arthritis, hypertension, hyperlipidemia, transient ischemic attack, vascular dementia, atrial fibrillation from nursing home facility on oral eliquis for anticoagulation had sustained a fall. Patient lost balance and fell and landed on her left hip. Patient has tenderness in the left hip area  1. Intertrochanteric fracture left hip due to Accidental fall -pt is at intermediate risk for surgery -both pt and dter understand -surgery planned for Sunday by Dr Ernest PineHooten since pt needs to be off eliquis for 48 hours -prn  pain meds -IVF at 50 cc/hr  2. Hypertension -amlodipine  3.  Hyperlipidemia -cont statins  4. Arthritis -prn pain meds  Case discussed with Care Management/Social Worker. Management plans discussed with the patient, family and they are in agreement.  CODE STATUS:DNR  DVT Prophylaxis: holding eliquis  TOTAL TIME TAKING CARE OF THIS PATIENT: 30 minutes.  >50% time spent on counselling and coordination of care pt ans dter in the room  POSSIBLE D/C IN 3-4 DAYS, DEPENDING ON CLINICAL CONDITION.  Note: This dictation was prepared with Dragon dictation along with smaller phrase technology. Any transcriptional errors that result from this process are unintentional.  Lachelle Rissler M.D on 10/18/2016 at 7:20 PM  Between 7am to 6pm - Pager - [phone]  After 6pm go to [email or web address] - password [email or web address]  [facility]  Office  [phone]  CC: Primary care physician; Tillman Abide, MD

## 2016-10-18 NOTE — ED Notes (Signed)
Patient's daughter leaving at this time. Patient sleeping in room with NAD noted; awaiting admission assessment. Will continue to monitor.

## 2016-10-19 ENCOUNTER — Encounter: Payer: Self-pay | Admitting: Anesthesiology

## 2016-10-19 MED ORDER — MORPHINE SULFATE (PF) 2 MG/ML IV SOLN
1.0000 mg | INTRAVENOUS | Status: DC | PRN
  Administered 2016-10-19 – 2016-10-22 (×4): 2 mg via INTRAVENOUS
  Filled 2016-10-19 (×4): qty 1

## 2016-10-19 MED ORDER — CLINDAMYCIN PHOSPHATE 600 MG/50ML IV SOLN
600.0000 mg | INTRAVENOUS | Status: DC
  Filled 2016-10-19: qty 50

## 2016-10-19 NOTE — Plan of Care (Signed)
Problem: Bowel/Gastric: Goal: Will not experience complications related to bowel motility Outcome: Progressing Pt remains waiting for surgery,

## 2016-10-19 NOTE — Progress Notes (Signed)
Pt received iv morphine at this time for ongoing pain, pt repositioned and traction resettled for pt as well. Pt made high pitched sounds and cried without tears while moving, resolved quickly. Foley remains patent, ivf continues with site free of redness and swelling. Family member at bedside. Per family member, pt fell last easter and broke several ribs, and has steadily "gone downhill" since then, family figured it was a matter of time before a hip fx happened or something similar happened. Family members aware that pt may not rehab well from surgery, especially due to contractures to her hands and fingers.

## 2016-10-19 NOTE — Progress Notes (Signed)
Shift assessment completed at 0800. Pt is awake, family member arrived at bedside. Pt is oriented, understands that she will not go to surgery until tomorrow am. Pt is on room air, lungs are clear, HR is irregular, abdomen is soft, bs heard. Pt has foley draining yellow urine, Buck's traction at 5 # intact to pt's L leg, ppp, cap refill is wnl. Pt has telebox in place. PIV #20 intact to R lateral AC with iv ns infusing at 6875ml/shr, site is free of redness and swelling. Pt has skin tear noted to L elbow, area is dry with steri strips intact. Since assessment, pt received tylenol po at 0845, iv morphine at 1030 for pain, and percocet at 1221 for continued pain that pt continues to state is unrelieved. Pt has a flat affect and speech is clear but very soft, pt has had poor po intake

## 2016-10-19 NOTE — Progress Notes (Signed)
[facility] - [place] at [facility]   PATIENT NAME: Kristina Aguirre    MR#:  [id number]  DATE OF BIRTH:  [birth year]  SUBJECTIVE:  Came in after having mechanical fall C/o left hip pain  REVIEW OF SYSTEMS:   Review of Systems  Constitutional: Negative for chills, fever and weight loss.  HENT: Negative for ear discharge, ear pain and nosebleeds.   Eyes: Negative for blurred vision, pain and discharge.  Respiratory: Negative for sputum production, shortness of breath, wheezing and stridor.   Cardiovascular: Negative for chest pain, palpitations, orthopnea and PND.  Gastrointestinal: Negative for abdominal pain, diarrhea, nausea and vomiting.  Genitourinary: Negative for frequency and urgency.  Musculoskeletal: Positive for falls and joint pain. Negative for back pain.  Neurological: Positive for weakness. Negative for sensory change, speech change and focal weakness.  Psychiatric/Behavioral: Negative for depression and hallucinations. The patient is not nervous/anxious.    Tolerating Diet:yes ,some Tolerating PT: pending  DRUG ALLERGIES:   Allergies  Allergen Reactions  . Penicillins Hives    VITALS:  Blood pressure (!) 156/64, pulse 60, temperature 98.3 F (36.8 C), temperature source Oral, resp. rate 18, height 5\' 3"  (1.6 m), weight 50.1 kg (110 lb 8 oz), SpO2 98 %.  PHYSICAL EXAMINATION:   Physical Exam  GENERAL:  83 y.o.-year-old patient lying in the bed with no acute distress.  EYES: Pupils equal, round, reactive to light and accommodation. No scleral icterus. Extraocular muscles intact.  HEENT: Head atraumatic, normocephalic. Oropharynx and nasopharynx clear.  NECK:  Supple, no jugular venous distention. No thyroid enlargement, no tenderness.  LUNGS: Normal breath sounds bilaterally, no wheezing, rales, rhonchi. No use of accessory muscles of respiration.  CARDIOVASCULAR: S1, S2 normal. No murmurs, rubs, or gallops.  ABDOMEN: Soft, nontender,  nondistended. Bowel sounds present. No organomegaly or mass.  EXTREMITIES: No cyanosis, clubbing or edema b/l.   Left leg buck's traction NEUROLOGIC: Cranial nerves II through XII are intact. No focal Motor or sensory deficits b/l.   PSYCHIATRIC:  patient is alert and oriented x 3.  SKIN: No obvious rash, lesion, or ulcer.   LABORATORY PANEL:  CBC  Recent Labs Lab 10/18/16 0444  WBC 14.8*  HGB 12.5  HCT 36.3  PLT 332    Chemistries   Recent Labs Lab 10/18/16 0444  NA 137  K 4.6  CL 105  CO2 24  GLUCOSE 114*  BUN 51*  CREATININE 1.97*  CALCIUM 8.7*   Cardiac Enzymes  Recent Labs Lab 10/17/16 2156  TROPONINI <0.03   RADIOLOGY:  Dg Chest 1 View  Result Date: 10/17/2016 CLINICAL DATA:  Preop left hip fracture EXAM: CHEST 1 VIEW COMPARISON:  03/05/2016 FINDINGS: The patient's head and chin obscure the lung apices. The right lung is grossly clear. Streaky opacities at the left base probably reflect atelectasis. No gross consolidation or effusion. Stable cardiomegaly. Diffuse osteopenia. IMPRESSION: Upper lung zones are obscured by the patient's head and chin. Streaky atelectasis at the left base.  Stable cardiomegaly. Electronically Signed   By: Jasmine Pang M.D.   On: 10/17/2016 22:22   Ct Head Wo Contrast  Result Date: 10/17/2016 CLINICAL DATA:  Unwitnessed fall.  Patient may have hit head. EXAM: CT HEAD WITHOUT CONTRAST CT CERVICAL SPINE WITHOUT CONTRAST TECHNIQUE: Multidetector CT imaging of the head and cervical spine was performed following the standard protocol without intravenous contrast. Multiplanar CT image reconstructions of the cervical spine were also generated. COMPARISON:  None. FINDINGS: CT HEAD FINDINGS Brain: Moderate cerebral  atrophy with diffuse chronic small vessel white matter changes are present bilaterally. No acute intracranial hemorrhage, midline shift or edema. No intra-axial mass nor extra-axial fluid collections. Vascular: Carotid siphon  calcifications are again noted. No hyperdense vessels. Skull: Nonacute Sinuses/Orbits: Nonacute Other: None CT CERVICAL SPINE FINDINGS Alignment: Reversal cervical lordosis again noted. 3 mm anterolisthesis of C4 on C5 is also unchanged. Skull base and vertebrae: No acute fracture bone destruction. Soft tissues and spinal canal: No prevertebral soft tissue swelling nor intraspinal hemorrhage. Disc levels: Disc space narrowing at C4-5 and C5-6 as before. Multilevel degenerative facet ankylosis from C2 through C7. No significant neural foraminal encroachment. Upper chest: Nonacute Other: None IMPRESSION: Cerebral atrophy with chronic moderate small vessel ischemic disease of periventricular white matter. No acute intracranial abnormality. Reversal cervical lordosis appears chronic with minimal anterolisthesis of C4 on C5 as before. No acute osseous abnormality. Cervical spondylosis. Bilateral facet ankylosis. Electronically Signed   By: Tollie Eth M.D.   On: 10/17/2016 22:41   Ct Cervical Spine Wo Contrast  Result Date: 10/17/2016 CLINICAL DATA:  Unwitnessed fall.  Patient may have hit head. EXAM: CT HEAD WITHOUT CONTRAST CT CERVICAL SPINE WITHOUT CONTRAST TECHNIQUE: Multidetector CT imaging of the head and cervical spine was performed following the standard protocol without intravenous contrast. Multiplanar CT image reconstructions of the cervical spine were also generated. COMPARISON:  None. FINDINGS: CT HEAD FINDINGS Brain: Moderate cerebral atrophy with diffuse chronic small vessel white matter changes are present bilaterally. No acute intracranial hemorrhage, midline shift or edema. No intra-axial mass nor extra-axial fluid collections. Vascular: Carotid siphon calcifications are again noted. No hyperdense vessels. Skull: Nonacute Sinuses/Orbits: Nonacute Other: None CT CERVICAL SPINE FINDINGS Alignment: Reversal cervical lordosis again noted. 3 mm anterolisthesis of C4 on C5 is also unchanged. Skull base and  vertebrae: No acute fracture bone destruction. Soft tissues and spinal canal: No prevertebral soft tissue swelling nor intraspinal hemorrhage. Disc levels: Disc space narrowing at C4-5 and C5-6 as before. Multilevel degenerative facet ankylosis from C2 through C7. No significant neural foraminal encroachment. Upper chest: Nonacute Other: None IMPRESSION: Cerebral atrophy with chronic moderate small vessel ischemic disease of periventricular white matter. No acute intracranial abnormality. Reversal cervical lordosis appears chronic with minimal anterolisthesis of C4 on C5 as before. No acute osseous abnormality. Cervical spondylosis. Bilateral facet ankylosis. Electronically Signed   By: Tollie Eth M.D.   On: 10/17/2016 22:41   Dg Hip Unilat W Or Wo Pelvis 2-3 Views Left  Result Date: 10/17/2016 CLINICAL DATA:  Unwitnessed fall tonight. EXAM: DG HIP (WITH OR WITHOUT PELVIS) 2-3V LEFT COMPARISON:  None. FINDINGS: There is an intertrochanteric left hip fracture with longitudinal component extending into the proximal diaphysis. There is varus angulation. No dislocation. No radiographic findings to suggest a pathologic basis for the fracture. Bony pelvis is intact. IMPRESSION: Intertrochanteric left hip fracture Electronically Signed   By: Ellery Plunk M.D.   On: 10/17/2016 22:15   ASSESSMENT AND PLAN:  Tashara Suder  is a 82 y.o. female with a known history of Arthritis, hypertension, hyperlipidemia, transient ischemic attack, vascular dementia, atrial fibrillation from nursing home facility on oral eliquis for anticoagulation had sustained a fall. Patient lost balance and fell and landed on her left hip. Patient has tenderness in the left hip area  1. Intertrochanteric fracture left hip due to Accidental fall -pt is at intermediate risk for surgery -both pt and dter understand -surgery planned for Sunday by Dr Ernest Pine since pt needs to be off eliquis for  48 hours -prn pain meds -IVF at 50  cc/hr  2. Hypertension -amlodipine  3. Hyperlipidemia -cont statins  4. Arthritis -prn pain meds  5. CSW for d/c planning  Case discussed with Care Management/Social Worker. Management plans discussed with the patient, family and they are in agreement.  CODE STATUS:DNR  DVT Prophylaxis: holding eliquis  TOTAL TIME TAKING CARE OF THIS PATIENT: 30 minutes.  >50% time spent on counselling and coordination of care pt ans dter in the room  POSSIBLE D/C IN 2-3 DAYS, DEPENDING ON CLINICAL CONDITION.  Note: This dictation was prepared with Dragon dictation along with smaller phrase technology. Any transcriptional errors that result from this process are unintentional.  Kalliope Riesen M.D on 10/19/2016 at 9:48 AM  Between 7am to 6pm - Pager - [phone]  After 6pm go to [email or web address] - password [email or web address] [facility]  [facility]  Office  [phone]  CC: Primary care physician; Tillman Abideichard Letvak, MD

## 2016-10-19 NOTE — Progress Notes (Signed)
Anesthesia in to see pt, Dr. Ivan Anchorsequested that PT/INR be ordered for the morning.

## 2016-10-19 NOTE — Clinical Social Work Note (Signed)
Clinical Social Work Assessment  Patient Details  Name: Kristina Aguirre C Streng MRN: [id number] Date of Birth: 09/01/1927  Date of referral:  10/19/16               Reason for consult:  Facility Placement                Permission sought to share information with:  Facility Industrial/product designerContact Representative Permission granted to share information::  Yes, Verbal Permission Granted  Name::        Agency::     Relationship::     Contact Information:     Housing/Transportation Living arrangements for the past 2 months:  Skilled Nursing Facility Source of Information:  Adult Children Patient Interpreter Needed:  None Criminal Activity/Legal Involvement Pertinent to Current Situation/Hospitalization:  No - Comment as needed Significant Relationships:  Adult Children Lives with:  Facility Resident Do you feel safe going back to the place where you live?  Yes Need for family participation in patient care:  Yes (Comment)  Care giving concerns:  Ad[facility]   Social Worker assessment / plan:  CSW visited patient and her son, Renae Fickleaul, at bedside to introduce self and explain role of CSW in dc planning. Renae Fickleaul reported that the family would like the patient to return to the facility via EMS once she is ready for dc. The patient is baseline confused, non-mobile, and incontinent of both bladder and bowel.  Employment status:  Retired Health and safety inspectornsurance information:  Medicare PT Recommendations:  Skilled Nursing Facility Information / Referral to community resources:  Skilled Nursing Facility  Patient/Family's Response to care:  Patient's son is in agreement with plan and thanked CSW for assistance.  Patient/Family's Understanding of and Emotional Response to Diagnosis, Current Treatment, and Prognosis:  Patient's son is in agreement with plan.  Emotional Assessment Appearance:  Appears stated age Attitude/Demeanor/Rapport:  Lethargic Affect (typically observed):  Appropriate, Pleasant Orientation:   Oriented to Self Alcohol / Substance use:    Psych involvement (Current and /or in the community):  No (Comment)  Discharge Needs  Concerns to be addressed:  Care Coordination Readmission within the last 30 days:  No Current discharge risk:  None Barriers to Discharge:  Continued Medical Work up   [employer] CorporationKaren M Whitaker Holderman, LCSW 10/19/2016, 4:09 PM

## 2016-10-19 NOTE — Anesthesia Preprocedure Evaluation (Deleted)
Anesthesia Evaluation  Patient identified by MRN, date of birth, ID band Patient awake    Reviewed: Allergy & Precautions, NPO status , Patient's Chart, lab work & pertinent test results, reviewed documented beta blocker date and time   Airway Mallampati: IV  TM Distance: >3 FB     Dental  (+) Chipped   Pulmonary           Cardiovascular hypertension, Pt. on medications + Peripheral Vascular Disease  + dysrhythmias Atrial Fibrillation      Neuro/Psych PSYCHIATRIC DISORDERS Anxiety TIA Neuromuscular disease CVA, Residual Symptoms    GI/Hepatic   Endo/Other    Renal/GU Renal InsufficiencyRenal disease     Musculoskeletal  (+) Arthritis ,   Abdominal   Peds  Hematology   Anesthesia Other Findings Dementia. Swallowing difficulty. Shingles. Hb 12.5. INR 1.59 today will check in am. CXR atelectasis.   Reproductive/Obstetrics                             Anesthesia Physical Anesthesia Plan  ASA: IV  Anesthesia Plan: Spinal   Post-op Pain Management:    Induction:   Airway Management Planned:   Additional Equipment:   Intra-op Plan:   Post-operative Plan:   Informed Consent: I have reviewed the patients History and Physical, chart, labs and discussed the procedure including the risks, benefits and alternatives for the proposed anesthesia with the patient or authorized representative who has indicated his/her understanding and acceptance.     Plan Discussed with: CRNA  Anesthesia Plan Comments:         Anesthesia Quick Evaluation

## 2016-10-20 ENCOUNTER — Inpatient Hospital Stay: Payer: Medicare Other

## 2016-10-20 ENCOUNTER — Encounter
Admission: EM | Disposition: A | Discharge status: Skilled Nursing Facility | Payer: Self-pay | Source: Home / Self Care | Attending: Internal Medicine

## 2016-10-20 LAB — CBC WITH DIFFERENTIAL/PLATELET
Basophils Absolute: 0.1 10*3/uL (ref 0–0.1)
Basophils Relative: 1 %
Eosinophils Absolute: 0.1 10*3/uL (ref 0–0.7)
Eosinophils Relative: 1 %
HCT: 36.8 % (ref 35.0–47.0)
HEMOGLOBIN: 12.8 g/dL (ref 12.0–16.0)
LYMPHS ABS: 1.5 10*3/uL (ref 1.0–3.6)
LYMPHS PCT: 9 %
MCH: 32.2 pg (ref 26.0–34.0)
MCHC: 34.8 g/dL (ref 32.0–36.0)
MCV: 92.4 fL (ref 80.0–100.0)
Monocytes Absolute: 0.9 10*3/uL (ref 0.2–0.9)
Monocytes Relative: 6 %
NEUTROS ABS: 13.4 10*3/uL — AB (ref 1.4–6.5)
NEUTROS PCT: 83 %
Platelets: 344 10*3/uL (ref 150–440)
RBC: 3.98 MIL/uL (ref 3.80–5.20)
RDW: 13.9 % (ref 11.5–14.5)
WBC: 15.9 10*3/uL — ABNORMAL HIGH (ref 3.6–11.0)

## 2016-10-20 LAB — COMPREHENSIVE METABOLIC PANEL
ALK PHOS: 105 U/L (ref 38–126)
ALT: 18 U/L (ref 14–54)
AST: 24 U/L (ref 15–41)
Albumin: 3 g/dL — ABNORMAL LOW (ref 3.5–5.0)
Anion gap: 8 (ref 5–15)
BUN: 24 mg/dL — ABNORMAL HIGH (ref 6–20)
CALCIUM: 8.4 mg/dL — AB (ref 8.9–10.3)
CO2: 20 mmol/L — AB (ref 22–32)
CREATININE: 1.28 mg/dL — AB (ref 0.44–1.00)
Chloride: 113 mmol/L — ABNORMAL HIGH (ref 101–111)
GFR calc non Af Amer: 36 mL/min — ABNORMAL LOW (ref >60)
GFR, EST AFRICAN AMERICAN: 42 mL/min — AB (ref >60)
GLUCOSE: 107 mg/dL — AB (ref 65–99)
Potassium: 4.3 mmol/L (ref 3.5–5.1)
SODIUM: 141 mmol/L (ref 135–145)
Total Bilirubin: 1 mg/dL (ref 0.3–1.2)
Total Protein: 6.4 g/dL — ABNORMAL LOW (ref 6.5–8.1)

## 2016-10-20 LAB — URINALYSIS, COMPLETE (UACMP) WITH MICROSCOPIC
Bilirubin Urine: NEGATIVE
Glucose, UA: NEGATIVE mg/dL
Ketones, ur: 20 mg/dL — AB
Nitrite: NEGATIVE
SQUAMOUS EPITHELIAL / LPF: NONE SEEN
Specific Gravity, Urine: 1.016 (ref 1.005–1.030)
pH: 5 (ref 5.0–8.0)

## 2016-10-20 LAB — PROTIME-INR
INR: 1.48
Prothrombin Time: 18.1 seconds — ABNORMAL HIGH (ref 11.4–15.2)

## 2016-10-20 SURGERY — FIXATION, FRACTURE, INTERTROCHANTERIC, WITH INTRAMEDULLARY ROD
Anesthesia: Choice | Laterality: Left

## 2016-10-20 MED ORDER — ACETAMINOPHEN 650 MG RE SUPP
650.0000 mg | Freq: Four times a day (QID) | RECTAL | Status: DC | PRN
  Administered 2016-10-21 (×2): 650 mg via RECTAL
  Filled 2016-10-20 (×2): qty 1

## 2016-10-20 MED ORDER — FENTANYL CITRATE (PF) 250 MCG/5ML IJ SOLN
INTRAMUSCULAR | Status: AC
  Filled 2016-10-20: qty 5

## 2016-10-20 MED ORDER — SUCCINYLCHOLINE CHLORIDE 200 MG/10ML IV SOSY
PREFILLED_SYRINGE | INTRAVENOUS | Status: AC
  Filled 2016-10-20: qty 10

## 2016-10-20 MED ORDER — LIDOCAINE 2% (20 MG/ML) 5 ML SYRINGE
INTRAMUSCULAR | Status: AC
  Filled 2016-10-20: qty 5

## 2016-10-20 MED ORDER — ACETAMINOPHEN 325 MG PO TABS
650.0000 mg | ORAL_TABLET | ORAL | Status: DC | PRN
  Administered 2016-10-20 (×2): 650 mg via ORAL
  Filled 2016-10-20 (×2): qty 2

## 2016-10-20 MED ORDER — CEFTRIAXONE SODIUM-DEXTROSE 1-3.74 GM-% IV SOLR
1.0000 g | Freq: Every day | INTRAVENOUS | Status: DC
  Administered 2016-10-20 – 2016-10-22 (×3): 1 g via INTRAVENOUS
  Filled 2016-10-20 (×3): qty 50

## 2016-10-20 MED ORDER — ENSURE ENLIVE PO LIQD
1.0000 | Freq: Three times a day (TID) | ORAL | Status: DC
  Administered 2016-10-20 – 2016-10-23 (×5): 237 mL via ORAL

## 2016-10-20 MED ORDER — ACETAMINOPHEN 10 MG/ML IV SOLN
INTRAVENOUS | Status: AC
  Filled 2016-10-20: qty 100

## 2016-10-20 MED ORDER — NEOMYCIN-POLYMYXIN B GU 40-200000 IR SOLN
Status: AC
  Filled 2016-10-20: qty 4

## 2016-10-20 MED ORDER — PROPOFOL 10 MG/ML IV BOLUS
INTRAVENOUS | Status: AC
  Filled 2016-10-20: qty 20

## 2016-10-20 MED ORDER — ROCURONIUM BROMIDE 50 MG/5ML IV SOSY
PREFILLED_SYRINGE | INTRAVENOUS | Status: AC
  Filled 2016-10-20: qty 5

## 2016-10-20 MED ORDER — MIDAZOLAM HCL 2 MG/2ML IJ SOLN
INTRAMUSCULAR | Status: AC
  Filled 2016-10-20: qty 2

## 2016-10-20 MED ORDER — PHENYLEPHRINE HCL 10 MG/ML IJ SOLN
INTRAMUSCULAR | Status: AC
  Filled 2016-10-20: qty 1

## 2016-10-20 MED ORDER — DEXTROSE 5 % IV SOLN: 1.0000 g | INTRAVENOUS | Status: DC

## 2016-10-20 SURGICAL SUPPLY — 32 items
CANISTER SUCT 1200ML W/VALVE (MISCELLANEOUS) IMPLANT
DRAPE C-ARMOR (DRAPES) IMPLANT
DRAPE SHEET LG 3/4 BI-LAMINATE (DRAPES) IMPLANT
DRAPE TABLE BACK 80X90 (DRAPES) IMPLANT
DRSG DERMACEA 8X12 NADH (GAUZE/BANDAGES/DRESSINGS) IMPLANT
DRSG OPSITE POSTOP 3X4 (GAUZE/BANDAGES/DRESSINGS) IMPLANT
DRSG OPSITE POSTOP 4X12 (GAUZE/BANDAGES/DRESSINGS) IMPLANT
DURAPREP 26ML APPLICATOR (WOUND CARE) IMPLANT
ELECT REM PT RETURN 9FT ADLT (ELECTROSURGICAL)
ELECTRODE REM PT RTRN 9FT ADLT (ELECTROSURGICAL) IMPLANT
GAUZE SPONGE 4X4 12PLY STRL (GAUZE/BANDAGES/DRESSINGS) IMPLANT
GLOVE BIO SURGEON STRL SZ7.5 (GLOVE) IMPLANT
GLOVE BIO SURGEON STRL SZ8 (GLOVE) IMPLANT
GLOVE BIOGEL M STRL SZ7.5 (GLOVE) IMPLANT
GLOVE INDICATOR 8.0 STRL GRN (GLOVE) IMPLANT
GOWN STRL REUS W/ TWL LRG LVL3 (GOWN DISPOSABLE) IMPLANT
GOWN STRL REUS W/TWL LRG LVL3 (GOWN DISPOSABLE)
KIT RM TURNOVER CYSTO AR (KITS) IMPLANT
MAT BLUE FLOOR 46X72 FLO (MISCELLANEOUS) IMPLANT
NS IRRIG 1000ML POUR BTL (IV SOLUTION) IMPLANT
PACK HIP COMPR (MISCELLANEOUS) IMPLANT
REAMER ROD DEEP FLUTE 2.5X950 (INSTRUMENTS) IMPLANT
SOL PREP PVP 2OZ (MISCELLANEOUS)
SOLUTION PREP PVP 2OZ (MISCELLANEOUS) IMPLANT
STAPLER SKIN PROX 35W (STAPLE) IMPLANT
SUCTION FRAZIER HANDLE 10FR (MISCELLANEOUS)
SUCTION TUBE FRAZIER 10FR DISP (MISCELLANEOUS) IMPLANT
SUT VIC AB 0 CT1 36 (SUTURE) IMPLANT
SUT VIC AB 1 CT1 36 (SUTURE) IMPLANT
SUT VIC AB 2-0 CT1 27 (SUTURE)
SUT VIC AB 2-0 CT1 TAPERPNT 27 (SUTURE) IMPLANT
TAPE MICROFOAM 4IN (TAPE) IMPLANT

## 2016-10-20 NOTE — Progress Notes (Signed)
NA reported urine in bed, but pt has foley catheter in, on assessment foley draining amber colored urine. Pulled gently on tubing to position balloon tighter against wall. Will cont to monitor

## 2016-10-20 NOTE — Progress Notes (Signed)
Temp elevated, Dr Rhona RaiderHoooten notified. Orders placed per MD.

## 2016-10-20 NOTE — Consult Note (Signed)
ORTHOPAEDICS: Patient's temperature spiked to 101.4 axillary.INR still .1.3 Discussed with Dr. Marcello Moores (Anesthesiology) who recommended canceling the surgery today. I certainly agree.  Lungs were clear. Urine is cloudy.  CBC, Met C, UA, urine C&S, and CXR ordered. PT/INR in the AM. UTI is probably the most likely source of the fever. Discussed with Dr. Posey Pronto who will empirically treat with Rocephin. Will tentatively reschedule surgery for Monday pending her progress.  Findings discussed in detail with her family.  James P. Holley Bouche M.D.

## 2016-10-20 NOTE — Progress Notes (Signed)
Patient resting in bed. Fever decreasing. Family at bedside. No complaints at this time. Continue to monitor.

## 2016-10-20 NOTE — Plan of Care (Signed)
Problem: Education: Goal: Knowledge of [facility] Education information/materials will improve Outcome: Not Progressing Patient confused, family at bedside.   Problem: Safety: Goal: Ability to remain free from injury will improve Outcome: Progressing Patient has had no falls. Family at bedside and call for assistance when moving in bed.

## 2016-10-20 NOTE — Progress Notes (Signed)
Informed by dr Ernest Pinehooten that surgery cancelled due to temp 101.4 UA shows WBC CXR pending Start IV rocephin Follow cultures Tylenol prn

## 2016-10-20 NOTE — Progress Notes (Signed)
[facility] [place] at [facility]   PATIENT NAME: Kristina Aguirre    MR#:  [id number]  DATE OF BIRTH:  01/15/1927  SUBJECTIVE:  Came in after having mechanical fall Surgery scheduled for 8 am Denies any cp or sob  REVIEW OF SYSTEMS:   Review of Systems  Constitutional: Negative for chills, fever and weight loss.  HENT: Negative for ear discharge, ear pain and nosebleeds.   Eyes: Negative for blurred vision, pain and discharge.  Respiratory: Negative for sputum production, shortness of breath, wheezing and stridor.   Cardiovascular: Negative for chest pain, palpitations, orthopnea and PND.  Gastrointestinal: Negative for abdominal pain, diarrhea, nausea and vomiting.  Genitourinary: Negative for frequency and urgency.  Musculoskeletal: Positive for falls and joint pain. Negative for back pain.  Neurological: Positive for weakness. Negative for sensory change, speech change and focal weakness.  Psychiatric/Behavioral: Negative for depression and hallucinations. The patient is not nervous/anxious.    Tolerating Diet:yes ,some Tolerating PT: pending  DRUG ALLERGIES:   Allergies  Allergen Reactions  . Penicillins Hives    VITALS:  Blood pressure (!) 142/73, pulse 72, temperature 98.3 F (36.8 C), temperature source Oral, resp. rate 18, height 5\' 3"  (1.6 m), weight 50.1 kg (110 lb 8 oz), SpO2 95 %.  PHYSICAL EXAMINATION:   Physical Exam  GENERAL:  83 y.o.-year-old patient lying in the bed with no acute distress.  EYES: Pupils equal, round, reactive to light and accommodation. No scleral icterus. Extraocular muscles intact.  HEENT: Head atraumatic, normocephalic. Oropharynx and nasopharynx clear.  NECK:  Supple, no jugular venous distention. No thyroid enlargement, no tenderness.  LUNGS: Normal breath sounds bilaterally, no wheezing, rales, rhonchi. No use of accessory muscles of respiration.  CARDIOVASCULAR: S1, S2 normal. No murmurs, rubs, or  gallops.  ABDOMEN: Soft, nontender, nondistended. Bowel sounds present. No organomegaly or mass.  EXTREMITIES: No cyanosis, clubbing or edema b/l.   Left leg buck's traction NEUROLOGIC: Cranial nerves II through XII are intact. No focal Motor or sensory deficits b/l.   PSYCHIATRIC:  patient is alert and oriented x 3.  SKIN: No obvious rash, lesion, or ulcer.   LABORATORY PANEL:  CBC  Recent Labs Lab 10/18/16 0444  WBC 14.8*  HGB 12.5  HCT 36.3  PLT 332    Chemistries   Recent Labs Lab 10/18/16 0444  NA 137  K 4.6  CL 105  CO2 24  GLUCOSE 114*  BUN 51*  CREATININE 1.97*  CALCIUM 8.7*   Cardiac Enzymes  Recent Labs Lab 10/17/16 2156  TROPONINI <0.03   RADIOLOGY:  No results found. ASSESSMENT AND PLAN:  Kristina Aguirre  is a 83 y.o. female with a known history of Arthritis, hypertension, hyperlipidemia, transient ischemic attack, vascular dementia, atrial fibrillation from nursing home facility on oral eliquis for anticoagulation had sustained a fall. Patient lost balance and fell and landed on her left hip. Patient has tenderness in the left hip area  1. Intertrochanteric fracture left hip due to Accidental fall -pt is at intermediate risk for surgery -both pt and dter understand -surgery planned for today by Dr Ernest PineHooten since pt needs to be off eliquis for 48 hours -prn pain meds -IVF at 50 cc/hr  2. Hypertension -amlodipine  3. Hyperlipidemia -cont statins  4. Arthritis -prn pain meds  5. CSW for d/c planning  D/w son in the room Case discussed with Care Management/Social Worker. Management plans discussed with the patient, family and they are in agreement.  CODE STATUS:DNR  DVT Prophylaxis: holding eliquis  TOTAL TIME TAKING CARE OF THIS PATIENT: 30 minutes.  >50% time spent on counselling and coordination of care pt ans dter in the room  POSSIBLE D/C IN 2-3 DAYS, DEPENDING ON CLINICAL CONDITION.  Note: This dictation was prepared with Dragon  dictation along with smaller phrase technology. Any transcriptional errors that result from this process are unintentional.  Khristian Seals M.D on 10/20/2016 at 7:11 AM  Between 7am to 6pm - Pager - [phone]  After 6pm go to [email or web address] - password [email or web address] [facility]  [place] [facility]  Office  [phone]  CC: Primary care physician; Tillman Abide, MD

## 2016-10-21 ENCOUNTER — Inpatient Hospital Stay: Payer: Medicare Other

## 2016-10-21 ENCOUNTER — Inpatient Hospital Stay: Payer: Medicare Other | Admitting: Anesthesiology

## 2016-10-21 ENCOUNTER — Encounter
Admission: EM | Disposition: A | Discharge status: Skilled Nursing Facility | Payer: Self-pay | Source: Home / Self Care | Attending: Internal Medicine

## 2016-10-21 HISTORY — PX: INTRAMEDULLARY (IM) NAIL INTERTROCHANTERIC: SHX5875

## 2016-10-21 LAB — PROTIME-INR
INR: 1.38
INR: 1.36
Prothrombin Time: 17.1 seconds — ABNORMAL HIGH (ref 11.4–15.2)
Prothrombin Time: 16.9 seconds — ABNORMAL HIGH (ref 11.4–15.2)

## 2016-10-21 LAB — URINE CULTURE

## 2016-10-21 SURGERY — FIXATION, FRACTURE, INTERTROCHANTERIC, WITH INTRAMEDULLARY ROD
Anesthesia: Spinal | Site: Hip | Laterality: Left | Wound class: Clean

## 2016-10-21 MED ORDER — FENTANYL CITRATE (PF) 100 MCG/2ML IJ SOLN
INTRAMUSCULAR | Status: AC
  Filled 2016-10-21: qty 2

## 2016-10-21 MED ORDER — PHENOL 1.4 % MT LIQD
1.0000 | OROMUCOSAL | Status: DC | PRN
  Filled 2016-10-21: qty 177

## 2016-10-21 MED ORDER — ONDANSETRON HCL 4 MG/2ML IJ SOLN: 4.0000 mg | Freq: Once | INTRAMUSCULAR | Status: DC | PRN

## 2016-10-21 MED ORDER — FERROUS SULFATE 325 (65 FE) MG PO TABS
325.0000 mg | ORAL_TABLET | Freq: Two times a day (BID) | ORAL | Status: DC
  Administered 2016-10-22 – 2016-10-23 (×3): 325 mg via ORAL
  Filled 2016-10-21 (×3): qty 1

## 2016-10-21 MED ORDER — NEOMYCIN-POLYMYXIN B GU 40-200000 IR SOLN
Status: DC | PRN
  Administered 2016-10-21: 4 mL

## 2016-10-21 MED ORDER — PHENYLEPHRINE HCL 10 MG/ML IJ SOLN
INTRAMUSCULAR | Status: DC | PRN
  Administered 2016-10-21: 40 ug via INTRAVENOUS
  Administered 2016-10-21: 120 ug via INTRAVENOUS

## 2016-10-21 MED ORDER — FENTANYL CITRATE (PF) 100 MCG/2ML IJ SOLN
INTRAMUSCULAR | Status: DC | PRN
  Administered 2016-10-21 (×2): 50 ug via INTRAVENOUS

## 2016-10-21 MED ORDER — ACETAMINOPHEN 10 MG/ML IV SOLN
1000.0000 mg | Freq: Four times a day (QID) | INTRAVENOUS | Status: AC
  Administered 2016-10-21 – 2016-10-22 (×4): 1000 mg via INTRAVENOUS
  Filled 2016-10-21 (×3): qty 100

## 2016-10-21 MED ORDER — FLEET ENEMA 7-19 GM/118ML RE ENEM: 1.0000 | ENEMA | Freq: Once | RECTAL | Status: DC | PRN

## 2016-10-21 MED ORDER — LIDOCAINE 2% (20 MG/ML) 5 ML SYRINGE
INTRAMUSCULAR | Status: AC
  Filled 2016-10-21: qty 5

## 2016-10-21 MED ORDER — ONDANSETRON HCL 4 MG PO TABS: 4.0000 mg | ORAL_TABLET | Freq: Four times a day (QID) | ORAL | Status: DC | PRN

## 2016-10-21 MED ORDER — PROPOFOL 10 MG/ML IV BOLUS
INTRAVENOUS | Status: DC | PRN
Start: 2016-10-21 — End: 2016-10-21
  Administered 2016-10-21: 10 mg via INTRAVENOUS
  Administered 2016-10-21 (×2): 20 mg via INTRAVENOUS

## 2016-10-21 MED ORDER — MENTHOL 3 MG MT LOZG
1.0000 | LOZENGE | OROMUCOSAL | Status: DC | PRN
Start: 2016-10-21 — End: 2016-10-23
  Filled 2016-10-21: qty 9

## 2016-10-21 MED ORDER — PROPOFOL 10 MG/ML IV BOLUS
INTRAVENOUS | Status: AC
  Filled 2016-10-21: qty 40

## 2016-10-21 MED ORDER — PROPOFOL 500 MG/50ML IV EMUL
INTRAVENOUS | Status: DC | PRN
  Administered 2016-10-21: 50 ug/kg/min via INTRAVENOUS

## 2016-10-21 MED ORDER — MIDAZOLAM HCL 2 MG/2ML IJ SOLN
INTRAMUSCULAR | Status: AC
  Filled 2016-10-21: qty 2

## 2016-10-21 MED ORDER — BISACODYL 10 MG RE SUPP: 10.0000 mg | Freq: Every day | RECTAL | Status: DC | PRN

## 2016-10-21 MED ORDER — BUPIVACAINE HCL (PF) 0.5 % IJ SOLN
INTRAMUSCULAR | Status: DC | PRN
  Administered 2016-10-21: 3 mL

## 2016-10-21 MED ORDER — SENNOSIDES-DOCUSATE SODIUM 8.6-50 MG PO TABS
1.0000 | ORAL_TABLET | Freq: Two times a day (BID) | ORAL | Status: DC
  Administered 2016-10-21 – 2016-10-23 (×4): 1 via ORAL
  Filled 2016-10-21 (×4): qty 1

## 2016-10-21 MED ORDER — TRAMADOL HCL 50 MG PO TABS
50.0000 mg | ORAL_TABLET | Freq: Three times a day (TID) | ORAL | Status: DC
  Administered 2016-10-21 – 2016-10-23 (×5): 50 mg via ORAL
  Filled 2016-10-21 (×5): qty 1

## 2016-10-21 MED ORDER — FENTANYL CITRATE (PF) 100 MCG/2ML IJ SOLN
25.0000 ug | INTRAMUSCULAR | Status: DC | PRN
  Administered 2016-10-21 (×2): 25 ug via INTRAVENOUS

## 2016-10-21 MED ORDER — SODIUM CHLORIDE 0.9 % IV SOLN
INTRAVENOUS | Status: DC | PRN
  Administered 2016-10-21: 30 ug/min via INTRAVENOUS

## 2016-10-21 MED ORDER — CLINDAMYCIN PHOSPHATE 600 MG/50ML IV SOLN
INTRAVENOUS | Status: AC
  Filled 2016-10-21: qty 50

## 2016-10-21 MED ORDER — ONDANSETRON HCL 4 MG/2ML IJ SOLN: 4.0000 mg | Freq: Four times a day (QID) | INTRAMUSCULAR | Status: DC | PRN

## 2016-10-21 MED ORDER — MAGNESIUM HYDROXIDE 400 MG/5ML PO SUSP: 30.0000 mL | Freq: Every day | ORAL | Status: DC | PRN

## 2016-10-21 MED ORDER — ACETAMINOPHEN 325 MG PO TABS: 650.0000 mg | ORAL_TABLET | Freq: Four times a day (QID) | ORAL | Status: DC | PRN

## 2016-10-21 MED ORDER — APIXABAN 2.5 MG PO TABS
2.5000 mg | ORAL_TABLET | Freq: Two times a day (BID) | ORAL | Status: DC
  Administered 2016-10-22 – 2016-10-23 (×3): 2.5 mg via ORAL
  Filled 2016-10-21 (×3): qty 1

## 2016-10-21 MED ORDER — ACETAMINOPHEN 650 MG RE SUPP: 650.0000 mg | Freq: Four times a day (QID) | RECTAL | Status: DC | PRN

## 2016-10-21 MED ORDER — PANTOPRAZOLE SODIUM 40 MG PO TBEC
40.0000 mg | DELAYED_RELEASE_TABLET | Freq: Two times a day (BID) | ORAL | Status: DC
  Administered 2016-10-21 – 2016-10-23 (×4): 40 mg via ORAL
  Filled 2016-10-21 (×4): qty 1

## 2016-10-21 MED ORDER — CLINDAMYCIN PHOSPHATE 600 MG/50ML IV SOLN
600.0000 mg | Freq: Four times a day (QID) | INTRAVENOUS | Status: AC
  Administered 2016-10-21 – 2016-10-22 (×2): 600 mg via INTRAVENOUS
  Filled 2016-10-21 (×2): qty 50

## 2016-10-21 MED ORDER — PROPOFOL 500 MG/50ML IV EMUL
INTRAVENOUS | Status: AC
  Filled 2016-10-21: qty 50

## 2016-10-21 MED ORDER — ACETAMINOPHEN 10 MG/ML IV SOLN
INTRAVENOUS | Status: AC
  Administered 2016-10-21: 1000 mg via INTRAVENOUS
  Filled 2016-10-21: qty 100

## 2016-10-21 MED ORDER — FENTANYL CITRATE (PF) 100 MCG/2ML IJ SOLN
INTRAMUSCULAR | Status: AC
  Administered 2016-10-21: 25 ug via INTRAVENOUS
  Filled 2016-10-21: qty 2

## 2016-10-21 MED ORDER — NEOMYCIN-POLYMYXIN B GU 40-200000 IR SOLN
Status: AC
  Filled 2016-10-21: qty 4

## 2016-10-21 MED ORDER — CLINDAMYCIN PHOSPHATE 600 MG/50ML IV SOLN
INTRAVENOUS | Status: DC | PRN
  Administered 2016-10-21: 600 mg via INTRAVENOUS

## 2016-10-21 MED ORDER — SODIUM CHLORIDE 0.9 % IV SOLN
INTRAVENOUS | Status: DC
  Administered 2016-10-21: 100 mL/h via INTRAVENOUS
  Administered 2016-10-22: 12:00:00 via INTRAVENOUS
  Administered 2016-10-23: 50 mL/h via INTRAVENOUS

## 2016-10-21 SURGICAL SUPPLY — 41 items
BIT DRILL 4.0X195MM (BIT) ×1 IMPLANT
BLADE HELICAL 95 (Orthopedic Implant) ×2 IMPLANT
CANISTER SUCT 1200ML W/VALVE (MISCELLANEOUS) ×2 IMPLANT
DRAPE C-ARMOR (DRAPES) ×2 IMPLANT
DRAPE SHEET LG 3/4 BI-LAMINATE (DRAPES) ×2 IMPLANT
DRAPE TABLE BACK 80X90 (DRAPES) ×2 IMPLANT
DRILL BIT 4.0X195MM (BIT) ×1
DRSG DERMACEA 8X12 NADH (GAUZE/BANDAGES/DRESSINGS) ×2 IMPLANT
DRSG OPSITE POSTOP 3X4 (GAUZE/BANDAGES/DRESSINGS) ×2 IMPLANT
DRSG OPSITE POSTOP 4X12 (GAUZE/BANDAGES/DRESSINGS) ×2 IMPLANT
DURAPREP 26ML APPLICATOR (WOUND CARE) ×2 IMPLANT
ELECT REM PT RETURN 9FT ADLT (ELECTROSURGICAL) ×2
ELECTRODE REM PT RTRN 9FT ADLT (ELECTROSURGICAL) ×1 IMPLANT
GAUZE SPONGE 4X4 12PLY STRL (GAUZE/BANDAGES/DRESSINGS) ×2 IMPLANT
GLOVE BIO SURGEON STRL SZ7.5 (GLOVE) ×2 IMPLANT
GLOVE BIO SURGEON STRL SZ8 (GLOVE) ×2 IMPLANT
GLOVE BIOGEL M STRL SZ7.5 (GLOVE) ×2 IMPLANT
GLOVE INDICATOR 8.0 STRL GRN (GLOVE) ×2 IMPLANT
GOWN STRL REUS W/ TWL LRG LVL3 (GOWN DISPOSABLE) ×2 IMPLANT
GOWN STRL REUS W/TWL LRG LVL3 (GOWN DISPOSABLE) ×2
GUIDEWIRE 3.2X400 (WIRE) ×2 IMPLANT
KIT RM TURNOVER CYSTO AR (KITS) ×2 IMPLANT
MAT BLUE FLOOR 46X72 FLO (MISCELLANEOUS) ×2 IMPLANT
NAIL TROCH 11 130DEG 360 (Nail) ×2 IMPLANT
NEEDLE FILTER BLUNT 18X 1/2SAF (NEEDLE) ×1
NEEDLE FILTER BLUNT 18X1 1/2 (NEEDLE) ×1 IMPLANT
NS IRRIG 1000ML POUR BTL (IV SOLUTION) ×2 IMPLANT
PACK HIP COMPR (MISCELLANEOUS) ×2 IMPLANT
REAMER ROD DEEP FLUTE 2.5X950 (INSTRUMENTS) ×2 IMPLANT
SCREW LOCKING IM 5.0MX42M (Screw) ×2 IMPLANT
SOL PREP PVP 2OZ (MISCELLANEOUS) ×2
SOLUTION PREP PVP 2OZ (MISCELLANEOUS) ×1 IMPLANT
STAPLER SKIN PROX 35W (STAPLE) ×2 IMPLANT
SUCTION FRAZIER HANDLE 10FR (MISCELLANEOUS) ×1
SUCTION TUBE FRAZIER 10FR DISP (MISCELLANEOUS) ×1 IMPLANT
SUT VIC AB 0 CT1 36 (SUTURE) ×2 IMPLANT
SUT VIC AB 1 CT1 36 (SUTURE) ×2 IMPLANT
SUT VIC AB 2-0 CT1 27 (SUTURE) ×2
SUT VIC AB 2-0 CT1 TAPERPNT 27 (SUTURE) ×1 IMPLANT
SYR 10ML LL (SYRINGE) ×2 IMPLANT
TAPE MICROFOAM 4IN (TAPE) ×2 IMPLANT

## 2016-10-21 NOTE — Anesthesia Preprocedure Evaluation (Signed)
Anesthesia Evaluation  Patient identified by MRN, date of birth, ID band Patient awake    Reviewed: Allergy & Precautions, H&P , NPO status , Patient's Chart, lab work & pertinent test results, reviewed documented beta blocker date and time   Airway Mallampati: II  TM Distance: <3 FB Neck ROM: limited    Dental  (+) Teeth Intact   Pulmonary neg pulmonary ROS,    Pulmonary exam normal        Cardiovascular hypertension, negative cardio ROS Normal cardiovascular exam Rhythm:regular Rate:Normal     Neuro/Psych PSYCHIATRIC DISORDERS TIA Neuromuscular disease CVA negative neurological ROS  negative psych ROS   GI/Hepatic negative GI ROS, Neg liver ROS,   Endo/Other  negative endocrine ROS  Renal/GU Renal diseasenegative Renal ROS  negative genitourinary   Musculoskeletal   Abdominal   Peds  Hematology negative hematology ROS (+)   Anesthesia Other Findings Past Medical History: No date: Arthritis No date: Hematuria No date: High cholesterol No date: Hypertension No date: Post herpetic neuralgia     Comment: Right side of neck No date: Renal disorder No date: Shingles No date: TIA (transient ischemic attack) No date: Vascular dementia Past Surgical History: No date: APPENDECTOMY No date: COLPORRHAPHY No date: TUBAL LIGATION BMI    Body Mass Index:  19.57 kg/m     Reproductive/Obstetrics negative OB ROS                             Anesthesia Physical Anesthesia Plan  ASA: III and emergent  Anesthesia Plan: Spinal   Post-op Pain Management:    Induction:   Airway Management Planned:   Additional Equipment:   Intra-op Plan:   Post-operative Plan:   Informed Consent: I have reviewed the patients History and Physical, chart, labs and discussed the procedure including the risks, benefits and alternatives for the proposed anesthesia with the patient or authorized  representative who has indicated his/her understanding and acceptance.   Dental Advisory Given  Plan Discussed with: CRNA  Anesthesia Plan Comments:         Anesthesia Quick Evaluation

## 2016-10-21 NOTE — Anesthesia Procedure Notes (Signed)
Spinal  Patient location during procedure: OR Start time: 10/21/2016 5:00 PM End time: 10/21/2016 5:08 PM Staffing Anesthesiologist: Naomie DeanKEPHART, WILLIAM K Performed: anesthesiologist  Preanesthetic Checklist Completed: patient identified, site marked, surgical consent, pre-op evaluation, IV checked, risks and benefits discussed and monitors and equipment checked Spinal Block Patient position: right lateral decubitus Prep: ChloraPrep Patient monitoring: heart rate, continuous pulse ox and blood pressure Approach: midline Location: L3-4 Injection technique: single-shot Needle Needle type: Pencan and Introducer  Needle gauge: 24 G Needle length: 10 cm

## 2016-10-21 NOTE — Progress Notes (Signed)
Patient had 10 beat run svt. Dr Sherryll Burgershah paged.

## 2016-10-21 NOTE — Progress Notes (Signed)
[facility] [place] [facility]   PATIENT NAME: Kristina Aguirre    MR#:  [id number]  DATE OF BIRTH:  10/11/1927  SUBJECTIVE:  Came in after having mechanical fall Pt had fever yday--surgery cancelled Poor po intake.  Hip pain+  REVIEW OF SYSTEMS:   Review of Systems  Constitutional: Negative for chills, fever and weight loss.  HENT: Negative for ear discharge, ear pain and nosebleeds.   Eyes: Negative for blurred vision, pain and discharge.  Respiratory: Negative for sputum production, shortness of breath, wheezing and stridor.   Cardiovascular: Negative for chest pain, palpitations, orthopnea and PND.  Gastrointestinal: Negative for abdominal pain, diarrhea, nausea and vomiting.  Genitourinary: Negative for frequency and urgency.  Musculoskeletal: Positive for falls and joint pain. Negative for back pain.  Neurological: Positive for weakness. Negative for sensory change, speech change and focal weakness.  Psychiatric/Behavioral: Negative for depression and hallucinations. The patient is not nervous/anxious.    Tolerating Diet:yes ,some Tolerating PT: pending  DRUG ALLERGIES:   Allergies  Allergen Reactions  . Penicillins Hives    VITALS:  Blood pressure (!) 152/74, pulse 76, temperature 100.2 F (37.9 C), temperature source Oral, resp. rate 20, height 5\' 3"  (1.6 m), weight 50.1 kg (110 lb 8 oz), SpO2 95 %.  PHYSICAL EXAMINATION:   Physical Exam  GENERAL:  83 y.o.-year-old patient lying in the bed with no acute distress.  EYES: Pupils equal, round, reactive to light and accommodation. No scleral icterus. Extraocular muscles intact.  HEENT: Head atraumatic, normocephalic. Oropharynx and nasopharynx clear.  NECK:  Supple, no jugular venous distention. No thyroid enlargement, no tenderness.  LUNGS: Normal breath sounds bilaterally, no wheezing, rales, rhonchi. No use of accessory muscles of respiration.  CARDIOVASCULAR: S1, S2 normal. No  murmurs, rubs, or gallops.  ABDOMEN: Soft, nontender, nondistended. Bowel sounds present. No organomegaly or mass. Foley+ EXTREMITIES: No cyanosis, clubbing or edema b/l.   Left leg buck's traction NEUROLOGIC: Cranial nerves II through XII are intact. No focal Motor or sensory deficits b/l.   PSYCHIATRIC:  patient is alert and oriented x 3.  SKIN: No obvious rash, lesion, or ulcer.   LABORATORY PANEL:  CBC  Recent Labs Lab 10/20/16 0816  WBC 15.9*  HGB 12.8  HCT 36.8  PLT 344    Chemistries   Recent Labs Lab 10/20/16 0816  NA 141  K 4.3  CL 113*  CO2 20*  GLUCOSE 107*  BUN 24*  CREATININE 1.28*  CALCIUM 8.4*  AST 24  ALT 18  ALKPHOS 105  BILITOT 1.0   Cardiac Enzymes  Recent Labs Lab 10/17/16 2156  TROPONINI <0.03   RADIOLOGY:  Dg Chest Port 1 View  Result Date: 10/20/2016 CLINICAL DATA:  Fevers EXAM: PORTABLE CHEST 1 VIEW COMPARISON:  10/17/2016 FINDINGS: Cardiac shadow is within normal limits. The lungs are well aerated bilaterally. No focal infiltrate or sizable effusion is seen. Stable left basilar atelectasis is noted. IMPRESSION: Stable left basilar atelectasis. Electronically Signed   By: Alcide Clever M.D.   On: 10/20/2016 08:38   ASSESSMENT AND PLAN:  Kristina Aguirre  is a 83 y.o. female with a known history of Arthritis, hypertension, hyperlipidemia, transient ischemic attack, vascular dementia, atrial fibrillation from nursing home facility on oral eliquis for anticoagulation had sustained a fall. Patient lost balance and fell and landed on her left hip. Patient has tenderness in the left hip area  1. Intertrochanteric fracture left hip due to Accidental fall -pt is at intermediate risk  for surgery -both pt and dter understand -surgery cancelled due to fever -prn pain meds -IVF at 100 c/hr -good uop. Creat stable  2. Hypertension -amlodipine  3. Hyperlipidemia -cont statins  4. fever due to GNR UTI -IV rocephin -UC GNR per lab cxR LL  atelectasis--incentive spirometer  5. DvT prophylaxis eliquis on hold inr 1.38  D/w family in the room Case discussed with Care Management/Social Worker. Management plans discussed with the patient, family and they are in agreement.  CODE STATUS:DNR  DVT Prophylaxis: holding eliquis  TOTAL TIME TAKING CARE OF THIS PATIENT: 30 minutes.  >50% time spent on counselling and coordination of care pt ans dter in the room  POSSIBLE D/C IN 2-3 DAYS, DEPENDING ON CLINICAL CONDITION.  Note: This dictation was prepared with Dragon dictation along with smaller phrase technology. Any transcriptional errors that result from this process are unintentional.  Ernie Sagrero M.D on 10/21/2016 at 8:47 AM  Between 7am to 6pm - Pager - [phone]  After 6pm go to [email or web address] - password [email or web address] [facility]  [facility]  Office  [phone]  CC: Primary care physician; Tillman Abideichard Letvak, MD

## 2016-10-21 NOTE — Care Management Important Message (Signed)
Important Message  Patient Details  Name: Samul Dadaancy C Harts MRN: [id number] Date of Birth: 12/19/1926   Medicare Important Message Given:  Yes    Marily MemosLisa M Kaytlen Lightsey, RN 10/21/2016, 10:47 AM

## 2016-10-21 NOTE — Transfer of Care (Signed)
Immediate Anesthesia Transfer of Care Note  Patient: Kristina Aguirre  Procedure(s) Performed: Procedure(s): INTRAMEDULLARY (IM) NAIL INTERTROCHANTRIC (Left)  Patient Location: PACU  Anesthesia Type:Spinal  Level of Consciousness: awake  Airway & Oxygen Therapy: Patient connected to face mask oxygen  Post-op Assessment: Post -op Vital signs reviewed and stable  Post vital signs: stable  Last Vitals:  Vitals:   10/21/16 1117 10/21/16 1945  BP: (!) 147/68 (!) 105/54  Pulse: 77 62  Resp: 17 (!) 31  Temp: 36.4 C (!) 35.8 C    Last Pain:  Vitals:   10/21/16 1945  TempSrc: Temporal  PainSc:          Complications: No apparent anesthesia complications

## 2016-10-21 NOTE — Brief Op Note (Signed)
10/17/2016 - 10/21/2016  7:49 PM  PATIENT:  Kristina Aguirre  83 y.o. female  PRE-OPERATIVE DIAGNOSIS:  Left intertrochanteric femur fracture  POST-OPERATIVE DIAGNOSIS:  Same  PROCEDURE:  Procedure(s): INTRAMEDULLARY (IM) NAIL INTERTROCHANTRIC (Left)  SURGEON:  Surgeon(s) and Role:    * Donato HeinzJames P Sherrin Stahle, MD - Primary  ASSISTANTS: none   ANESTHESIA:   spinal  EBL:  Total I/O In: -  Out: 300 [Urine:275; Blood:25]  BLOOD ADMINISTERED:none  DRAINS: none   LOCAL MEDICATIONS USED:  NONE  SPECIMEN:  No Specimen  DISPOSITION OF SPECIMEN:  N/A  COUNTS:  YES  TOURNIQUET:  * No tourniquets in log *  DICTATION: .Dragon Dictation  PLAN OF CARE: Admit to inpatient   PATIENT DISPOSITION:  PACU - hemodynamically stable.   Delay start of Pharmacological VTE agent (>24hrs) due to surgical blood loss or risk of bleeding: yes

## 2016-10-21 NOTE — Progress Notes (Signed)
Initial Nutrition Assessment  DOCUMENTATION CODES:   Not applicable  INTERVENTION:  1. With diet advancement, provide Mighty Shake II BID with meals, each supplement provides 480-500 kcals and 20-23 grams of protein  NUTRITION DIAGNOSIS:   Inadequate oral intake related to poor appetite, lethargy/confusion as evidenced by per patient/family report.  GOAL:   Patient will meet greater than or equal to 90% of their needs  MONITOR:   PO intake, I & O's, Labs, Weight trends, Supplement acceptance  REASON FOR ASSESSMENT:   Low Braden    ASSESSMENT:    Kristina Aguirre  is a 83 y.o. female with a known history of Arthritis, hypertension, hyperlipidemia, transient ischemic attack, vascular dementia, atrial fibrillation from nursing home facility on oral eliquis for anticoagulation had sustained a fall  Spoke mostly with patient's daughter in law at bedside. She reports patient was not [facility] a lot of [facility] but was consuming what I believe to be [employer] there and deriving most of her nutrition from those.  Daughter-in-Law denies any evidence of weight loss, patient denies clothes fitting differently or any other indicators of weight loss. No weight history per chart. She reports no issues chewing or swallowing outside of patient having to keep her chin tucked into her clavicle.  Causes her to have some trouble swallowing solid food - soft foods are better, but per Daughter in Law patient mostly "picks at food." Denies any nausea or vomiting.  Nutrition-Focused physical exam completed. Findings are moderate fat depletion, moderate-severe muscle depletion, and no edema.   Currently NPO awaiting surgery. Labs and medications reviewed: NS @ 11400mL/hr  Diet Order:  Diet NPO time specified Except for: Sips with Meds  Skin:  Reviewed, no issues  Last BM:  10/17/2016  Height:   Ht Readings from Last 1 Encounters:  10/18/16 5\' 3"  (1.6 m)    Weight:   Wt  Readings from Last 1 Encounters:  10/18/16 110 lb 8 oz (50.1 kg)    Ideal Body Weight:  52.27 kg  BMI:  Body mass index is 19.57 kg/m.  Estimated Nutritional Needs:   Kcal:  1093 - 1275 (MSJ x1.2-1.4)  Protein:  50-60 gm  Fluid:  >/= 1.1L  EDUCATION NEEDS:   No education needs identified at this time  Kristina AnoWilliam M. Charlesetta Milliron, MS, RD LDN Inpatient Clinical Dietitian Pager [phone]

## 2016-10-21 NOTE — Progress Notes (Signed)
Patient is having surgery today and will return to [facility] when medically stable. [facility] is aware of above.   [employer]Bailey Brack Shaddock, LCSW [phone]

## 2016-10-21 NOTE — Op Note (Signed)
OPERATIVE NOTE  DATE OF SURGERY:  10/21/2016  PATIENT NAME:  Kristina Aguirre   DOB: 03/27/1927  MRN: [id number]  PRE-OPERATIVE DIAGNOSIS: Left intertrochanteric femur fracture  POST-OPERATIVE DIAGNOSIS:  Same  PROCEDURE: Open reduction and internal fixation of a left intertrochanteric femur fracture   SURGEON:  Jena GaussJames P Maricarmen Braziel, Jr. M.D.  ANESTHESIA: spinal  ESTIMATED BLOOD LOSS: 25 mL  FLUIDS REPLACED: 700 mL of crystalloid  DRAINS: None  IMPLANTS UTILIZED: Synthes 11 mm x 360 mm/130 trochanteric fixation nail, 95 mm helical blade, 42 mm x 5.0 mm locking screw  INDICATIONS FOR SURGERY: Kristina Dadaancy C Bisig is a 83 y.o. year old female who fell and sustained a displaced left intertrochanteric femur fracture. After discussion of the risks and benefits of surgical intervention, the patient expressed understanding of the risks benefits and agree with plans for open reduction and internal fixation.   The risks, benefits, and alternatives were discussed at length including but not limited to the risks of infection, bleeding, nerve injury, stiffness, blood clots, the need for revision surgery, limb length inequality, cardiopulmonary complications, among others, and they were willing to proceed.  PROCEDURE IN DETAIL: The patient was brought into the operating room and, after adequate spinal anesthesia was achieved, patient was placed on the fracture table. All bony prominences were well-padded. The left lower extremity was placed in traction and a provisional reduction was performed and verified using the C-arm. The patient's left hip and leg were cleaned and prepped with alcohol and DuraPrep and draped in the usual sterile fashion. A "timeout" was performed as per usual protocol. A lateral incision was made extended from the proximal portion of the greater trochanter proximally. The fascia was incised in line with the skin incision and the fibers of the hip abductors were split in line. The tip of  the greater trochanter was palpated and a distally threaded guide pin was inserted into the tip of the greater trochanter and advanced into the medullary canal. Position was confirmed in both AP and lateral planes using the C-arm. A pilot hole was enlarged using a step drill. The distally threaded guide pin was exchanged by a beaded reaming rod that was advanced down the femoral shaft. Position was confirmed measurements were obtained. Was felt that a 360 mm long nail was appropriate. A Synthes 11 mm x 360 mm/130 trochanteric fixation nail was advanced over the guidepin and position confirmed using the C-arm. A second stab incision was made and the tissue protector was inserted through the outrigger device and advanced to the lateral cortex of the femur. A threaded screw guide pin was inserted into the femoral neck and head and position was again confirmed in both AP and lateral planes. Vision was were obtained and it was felt that a 95 mm helical blade was appropriate. The cortex was reamed and then a cannulated reamer was advanced over the guidepin to the appropriate depth. A 95 mm helical blade was then advanced over the guidepin and impacted into place. Good position was noted in multiple planes using the C-arm. The locking sleeve was engaged. Finally, the C-arm was positioned so as to visualize the knee in a lateral position. A third stab incision was made and a drill bit was passed so as to pass through the oblong distal screwhole. A 42 mm x 5.0 mm locking screw was then inserted.  The hip was visualized in all planes using the C-arm with good reduction appreciated and good position of the hardware noted.  The  wound was irrigated with copious amounts of normal saline with antibiotic solution and suctioned dry. Good hemostasis was appreciated. The fascia was reapproximated using interrupted sutures of #1 Vicryl. Subcutaneous tissue was approximated layers using first #0 Vicryl followed #2-0 Vicryl. The skin  was closed with skin staples. A sterile dressing was applied.  The patient tolerated the procedure well and was transported to the recovery room in stable condition.   Jena Gauss., M.D.

## 2016-10-21 NOTE — Progress Notes (Signed)
Patient resting in bed. Temp still elevated 100.2. Dr Ernest PineHooten aware. Dr Ernest Pinehooten will discuss with anesthesia in regards to plan for surgery. Mouth swabs given to patient and family. Abx infusing. Continue to monitor.

## 2016-10-22 ENCOUNTER — Encounter: Payer: Self-pay | Admitting: Orthopedic Surgery

## 2016-10-22 LAB — BASIC METABOLIC PANEL
Anion gap: 6 (ref 5–15)
BUN: 26 mg/dL — AB (ref 6–20)
CO2: 18 mmol/L — ABNORMAL LOW (ref 22–32)
Calcium: 7.8 mg/dL — ABNORMAL LOW (ref 8.9–10.3)
Chloride: 121 mmol/L — ABNORMAL HIGH (ref 101–111)
Creatinine, Ser: 0.98 mg/dL (ref 0.44–1.00)
GFR calc Af Amer: 58 mL/min — ABNORMAL LOW (ref >60)
GFR, EST NON AFRICAN AMERICAN: 50 mL/min — AB (ref >60)
GLUCOSE: 112 mg/dL — AB (ref 65–99)
POTASSIUM: 3.5 mmol/L (ref 3.5–5.1)
Sodium: 145 mmol/L (ref 135–145)

## 2016-10-22 LAB — CBC
HCT: 31.2 % — ABNORMAL LOW (ref 35.0–47.0)
Hemoglobin: 10.2 g/dL — ABNORMAL LOW (ref 12.0–16.0)
MCH: 30.7 pg (ref 26.0–34.0)
MCHC: 32.7 g/dL (ref 32.0–36.0)
MCV: 93.8 fL (ref 80.0–100.0)
PLATELETS: 308 10*3/uL (ref 150–440)
RBC: 3.33 MIL/uL — AB (ref 3.80–5.20)
RDW: 14 % (ref 11.5–14.5)
WBC: 13.9 10*3/uL — ABNORMAL HIGH (ref 3.6–11.0)

## 2016-10-22 MED ORDER — OXYCODONE-ACETAMINOPHEN 5-325 MG PO TABS
1.0000 | ORAL_TABLET | Freq: Four times a day (QID) | ORAL | Status: DC | PRN
  Administered 2016-10-22 – 2016-10-23 (×2): 1 via ORAL
  Filled 2016-10-22 (×2): qty 1

## 2016-10-22 MED ORDER — CEPHALEXIN 500 MG PO CAPS
500.0000 mg | ORAL_CAPSULE | Freq: Two times a day (BID) | ORAL | Status: DC
  Administered 2016-10-22 – 2016-10-23 (×3): 500 mg via ORAL
  Filled 2016-10-22 (×3): qty 1

## 2016-10-22 NOTE — Progress Notes (Signed)
Physical Therapy Treatment Patient Details Name: Samul Dadaancy C Brett MRN: [id number] DOB: 10/16/1926 Today's Date: 10/22/2016    History of Present Illness Pt. is an 83 y.o. female [facility] repiar of a Left Femur Fracture. Pt. PMHx includes: arthritis, HTN, TIA, and Vascular Dementia.     PT Comments    Pt is making limited progress towards goals secondary to pain. Pt very tearful this date and needs heavy motivation to participate in therapy. Pt able to participate in there-ex, however OOB mobility deferred as pt crying in pain unable to attempt sitting at EOB.  Follow Up Recommendations  SNF     Equipment Recommendations       Recommendations for Other Services       Precautions / Restrictions Precautions Precautions: Fall Restrictions Weight Bearing Restrictions: Yes LLE Weight Bearing: Weight bearing as tolerated    Mobility  Bed Mobility               General bed mobility comments: not attempted as pt groaning and grimacing in pain. Limited participation this date.  Transfers                    Ambulation/Gait                 Stairs            Wheelchair Mobility    Modified Rankin (Stroke Patients Only)       Balance                                    Cognition Arousal/Alertness: Awake/alert Behavior During Therapy: WFL for tasks assessed/performed Overall Cognitive Status: History of cognitive impairments - at baseline                      Exercises Other Exercises Other Exercises: supine ther-ex performed on L LE including ankle pumps, quad sets, SLRs, and hip abd/add. All ther-ex performed x 10 reps on L LE with max assist.     General Comments        Pertinent Vitals/Pain Pain Assessment: Faces Faces Pain Scale: Hurts whole lot Pain Location: Left Hip Pain Descriptors / Indicators: Operative site guarding;Discomfort;Crying Pain Intervention(s): Limited activity within  patient's tolerance    Home Living                      Prior Function            PT Goals (current goals can now be found in the care plan section) Acute Rehab PT Goals Patient Stated Goal: To get stronger PT Goal Formulation: With patient Time For Goal Achievement: 11/05/16 Potential to Achieve Goals: Good Progress towards PT goals: Progressing toward goals    Frequency    BID      PT Plan Current plan remains appropriate    Co-evaluation             End of Session   Activity Tolerance: Patient limited by pain Patient left: in bed;with bed alarm set;with family/visitor present;with SCD's reapplied     Time: [id number] PT Time Calculation (min) (ACUTE ONLY): 23 min  Charges:  $Therapeutic Exercise: 23-37 mins                    G Codes:      Sherrica Niehaus 10/22/2016, 5:06 PM Elizabeth PalauStephanie Kennette Cuthrell,  PT, DPT [phone]

## 2016-10-22 NOTE — Anesthesia Postprocedure Evaluation (Signed)
Anesthesia Post Note  Patient: Kristina Aguirre  Procedure(s) Performed: Procedure(s) (LRB): INTRAMEDULLARY (IM) NAIL INTERTROCHANTRIC (Left)  Patient location during evaluation: Nursing Unit Anesthesia Type: Spinal Level of consciousness: awake, awake and alert and oriented Pain management: pain level controlled Respiratory status: spontaneous breathing Cardiovascular status: stable Postop Assessment: no headache and no backache Anesthetic complications: no     Last Vitals:  Vitals:   10/22/16 0434 10/22/16 0813  BP: (!) 103/46 (!) 106/56  Pulse: 69 62  Resp:    Temp:  36.5 C    Last Pain:  Vitals:   10/22/16 0813  TempSrc: Oral  PainSc:                  Lyn RecordsNoles,  Gil Ingwersen R

## 2016-10-22 NOTE — Evaluation (Signed)
Physical Therapy Evaluation Patient Details Name: Kristina Aguirre MRN: [id number] DOB: 03/11/1927 Today's Date: 10/22/2016   History of Present Illness  Pt admitted for L femur fracture from fall and is now s/p ORIF on 10/21/16. Pt with history of arthritis, HTN, TIA, and vascular dementia. [facility]. Reports 2 falls in last year.  Clinical Impression  Pt is a pleasant 83 year old female who was admitted for L hip ORIF. Pt performs bed mobility with total assist and able to sit at EOB for a few minutes prior to fatigue and needs to be returned back supine. Unable to perform further mobility at this time. Pt demonstrates deficits with strength/pain/endurance/mobility. Pt slightly confused to situation, however able to participate and attempts with general there-ex while supine in bed. Pt is currently not at baseline level at this time. Would benefit from skilled PT to address above deficits and promote optimal return to PLOF; recommend transition to STR upon discharge from acute hospitalization.       Follow Up Recommendations SNF    Equipment Recommendations       Recommendations for Other Services       Precautions / Restrictions Precautions Precautions: Fall Restrictions Weight Bearing Restrictions: Yes LLE Weight Bearing: Weight bearing as tolerated      Mobility  Bed Mobility Overal bed mobility: Needs Assistance Bed Mobility: Supine to Sit     Supine to sit: Total assist;+2 for physical assistance     General bed mobility comments: assist for bed mobility including cues for sequencing. Needs heavy assist for moving towards seated position at EOB. Once seated, needs max assist to maintain seated balance. Fatigues quickly while seated at EOB.  Transfers                 General transfer comment: not appropriate for transfers at this time  Ambulation/Gait                Stairs            Wheelchair Mobility    Modified  Rankin (Stroke Patients Only)       Balance Overall balance assessment: History of Falls;Needs assistance Sitting-balance support: Feet unsupported;Single extremity supported Sitting balance-Leahy Scale: Poor                                       Pertinent Vitals/Pain Pain Assessment: Faces Faces Pain Scale: Hurts whole lot Pain Location: L hip Pain Descriptors / Indicators: Operative site guarding;Discomfort;Crying Pain Intervention(s): Limited activity within patient's tolerance;Repositioned;Ice applied;Premedicated before session    Home Living Family/patient expects to be discharged to:: Skilled nursing facility                      Prior Function Level of Independence: Needs assistance         Comments: per daughter, she receives help from staff for all ADLS. She uses RW for mobility, however at times forgets to use it.      Hand Dominance        Extremity/Trunk Assessment   Upper Extremity Assessment Upper Extremity Assessment: Generalized weakness (limited ROM in B shoulders; contractures on L hand; 3/5 B UE)    Lower Extremity Assessment Lower Extremity Assessment: Generalized weakness (R LE grossly 3/5; L LE grossly 2/5)       Communication   Communication: No difficulties  Cognition Arousal/Alertness:  Awake/alert Behavior During Therapy: WFL for tasks assessed/performed Overall Cognitive Status: History of cognitive impairments - at baseline                      General Comments      Exercises Other Exercises Other Exercises: Supine ther-ex performed on L LE including ankle pumps, quad sets, SLRs, hip abd/add, and SAQ. All ther-ex performed x 10 reps on L LE with mod/max assist.   Assessment/Plan    PT Assessment Patient needs continued PT services  PT Problem List Decreased strength;Decreased range of motion;Decreased activity tolerance;Decreased balance;Decreased mobility;Decreased knowledge of use of  DME;Pain          PT Treatment Interventions DME instruction;Gait training;Therapeutic exercise    PT Goals (Current goals can be found in the Care Plan section)  Acute Rehab PT Goals Patient Stated Goal: to get stronger PT Goal Formulation: With patient Time For Goal Achievement: 11/05/16 Potential to Achieve Goals: Good    Frequency BID   Barriers to discharge        Co-evaluation               End of Session   Activity Tolerance: Patient limited by pain Patient left: in bed;with bed alarm set;with family/visitor present;with SCD's reapplied Nurse Communication: Mobility status         Time: 1610-9604 PT Time Calculation (min) (ACUTE ONLY): 33 min   Charges:   PT Evaluation $PT Eval Moderate Complexity: 1 Procedure PT Treatments $Therapeutic Exercise: 8-22 mins   PT G Codes:        Kristina Aguirre 11-19-16, 11:17 AM Elizabeth Palau, PT, DPT [phone]

## 2016-10-22 NOTE — Progress Notes (Signed)
[facility] - [place] [facility]   PATIENT NAME: Kristina Aguirre    MR#:  [id number]  DATE OF BIRTH:  1927-01-27  SUBJECTIVE:  Came in after having mechanical fall POD #1 Poor po intake.  Hip pain+ not much motivated for PT  REVIEW OF SYSTEMS:   Review of Systems  Constitutional: Negative for chills, fever and weight loss.  HENT: Negative for ear discharge, ear pain and nosebleeds.   Eyes: Negative for blurred vision, pain and discharge.  Respiratory: Negative for sputum production, shortness of breath, wheezing and stridor.   Cardiovascular: Negative for chest pain, palpitations, orthopnea and PND.  Gastrointestinal: Negative for abdominal pain, diarrhea, nausea and vomiting.  Genitourinary: Negative for frequency and urgency.  Musculoskeletal: Positive for falls and joint pain. Negative for back pain.  Neurological: Positive for weakness. Negative for sensory change, speech change and focal weakness.  Psychiatric/Behavioral: Negative for depression and hallucinations. The patient is not nervous/anxious.    Tolerating Diet:yes ,some Tolerating PT:STR  DRUG ALLERGIES:   Allergies  Allergen Reactions  . Penicillins Hives    VITALS:  Blood pressure 139/67, pulse 76, temperature 97.6 F (36.4 C), temperature source Oral, resp. rate 18, height 5\' 3"  (1.6 m), weight 50.1 kg (110 lb 8 oz), SpO2 96 %.  PHYSICAL EXAMINATION:   Physical Exam  GENERAL:  83 y.o.-year-old patient lying in the bed with no acute distress.  EYES: Pupils equal, round, reactive to light and accommodation. No scleral icterus. Extraocular muscles intact.  HEENT: Head atraumatic, normocephalic. Oropharynx and nasopharynx clear.  NECK:  Supple, no jugular venous distention. No thyroid enlargement, no tenderness.  LUNGS: Normal breath sounds bilaterally, no wheezing, rales, rhonchi. No use of accessory muscles of respiration.  CARDIOVASCULAR: S1, S2 normal. No murmurs, rubs, or  gallops.  ABDOMEN: Soft, nontender, nondistended. Bowel sounds present. No organomegaly or mass. Foley+ EXTREMITIES: No cyanosis, clubbing or edema b/l.   Left leg buck's traction NEUROLOGIC: Cranial nerves II through XII are intact. No focal Motor or sensory deficits b/l.   PSYCHIATRIC:  patient is alert and oriented x 3.  SKIN: No obvious rash, lesion, or ulcer.   LABORATORY PANEL:  CBC  Recent Labs Lab 10/22/16 0445  WBC 13.9*  HGB 10.2*  HCT 31.2*  PLT 308    Chemistries   Recent Labs Lab 10/20/16 0816 10/22/16 0445  NA 141 145  K 4.3 3.5  CL 113* 121*  CO2 20* 18*  GLUCOSE 107* 112*  BUN 24* 26*  CREATININE 1.28* 0.98  CALCIUM 8.4* 7.8*  AST 24  --   ALT 18  --   ALKPHOS 105  --   BILITOT 1.0  --    Cardiac Enzymes  Recent Labs Lab 10/17/16 2156  TROPONINI <0.03   RADIOLOGY:  Dg Hip Operative Unilat W Or W/o Pelvis Left  Result Date: 10/21/2016 CLINICAL DATA:  Left hip fixation. EXAM: OPERATIVE LEFT HIP (WITH PELVIS IF PERFORMED) 1 VIEWS TECHNIQUE: Fluoroscopic spot image(s) were submitted for interpretation post-operatively. COMPARISON:  10/17/2016 FINDINGS: placement of screw fixation device across the previously described proximal left femur fracture. Alignment is anatomic in 1 plane, without acute hardware complication identified. Distal portion of the fixation device is excluded. IMPRESSION: Intraoperative imaging of proximal left femoral fixation. Electronically Signed   By: Jeronimo Greaves M.D.   On: 10/21/2016 19:34   Dg Femur Port Min 2 Views Left  Result Date: 10/21/2016 CLINICAL DATA:  Follow-up femur fracture EXAM: LEFT FEMUR PORTABLE 2 VIEWS  COMPARISON:  10/17/2016 FINDINGS: Patient is status post intramedullary rod fixation of the left femur intertrochanteric fracture with anatomic alignment. Single fixating screw distally. No femoral head dislocation. Cutaneous staples lateral hip. Tiny pockets of soft tissue gas are felt postoperative. IMPRESSION:  Interval surgical fixation of proximal left femur fracture with anatomic alignment and expected postsurgical changes. Electronically Signed   By: Jasmine PangKim  Fujinaga M.D.   On: 10/21/2016 20:58   ASSESSMENT AND PLAN:  Kristina Aguirre  is a 83 y.o. female with a known history of Arthritis, hypertension, hyperlipidemia, transient ischemic attack, vascular dementia, atrial fibrillation from nursing home facility on oral eliquis for anticoagulation had sustained a fall. Patient lost balance and fell and landed on her left hip. Patient has tenderness in the left hip area  1. Intertrochanteric fracture left hip due to Accidental fall -s/p surgery POD #1  2. Hypertension -amlodipine  3. Hyperlipidemia -cont statins  4. fever due to GNR UTI -IV rocephin---change to po keflex -UC GNR per lab cxR LL atelectasis--incentive spirometer  5. DvT prophylaxis resumed eliquis   D/w family in the room Case discussed with Care Management/Social Worker. Management plans discussed with the patient, family and they are in agreement.  CODE STATUS:DNR  DVT Prophylaxis: resumed eliquis  TOTAL TIME TAKING CARE OF THIS PATIENT: 30 minutes.  >50% time spent on counselling and coordination of care pt ans dter in the room  POSSIBLE D/C IN 1-2 DAYS, DEPENDING ON CLINICAL CONDITION.  Note: This dictation was prepared with Dragon dictation along with smaller phrase technology. Any transcriptional errors that result from this process are unintentional.  Isaura Schiller M.D on 10/22/2016 at 8:14 PM  Between 7am to 6pm - Pager - [phone]  After 6pm go to [email or web address] - password [email or web address] [facility]  Office  [phone]  CC: Primary care physician; Tillman Abideichard Letvak, MD

## 2016-10-22 NOTE — Progress Notes (Signed)
Occupational Therapy Treatment Patient Details Name: Kristina Aguirre MRN: [id number] DOB: [birth year] Today's Date: 10/22/2016    History of present illness Pt. is an 83 y.o. female who was [facility] for ORIF repiar of a Left Femur Fracture. Pt. PMHx includes: arthritis, HTN, TIA, and Vascular Dementia.    OT comments  Pt. Is an 83 y.o female who was admitted to [facility] for an ORIF repair of a Left Femur Fracture. Pt. Presents with bilateral hand contractures, pain, arthritis, weakness, multi infarct Dementia, and impaired functional mobility which hinder her ability to complete basic ADL tasks. Pt. Could benefit from skilled OT services for ADL training, A/E training, and positioning, and contracture management.  Pt. Plans to return to SNF level of care upon discharge.    Follow Up Recommendations  SNF     Equipment Recommendations       Recommendations for Other Services      Precautions / Restrictions Precautions Precautions: Fall Restrictions Weight Bearing Restrictions: Yes LLE Weight Bearing: Weight bearing as tolerated              ADL Overall ADL's : Needs assistance/impaired Eating/Feeding:  (Assist required secondary to arthritis/contractures in bil ateral hands.)                   Lower Body Dressing: Total assistance                        Vision                     Perception     Praxis      Cognition   Behavior During Therapy: WFL for tasks assessed/performed Overall Cognitive Status: History of cognitive impairments - at baseline                       Extremity/Trunk Assessment  Upper Extremity Assessment Upper Extremity Assessment: Generalized weakness (limited ROM in B shoulders; contractures on L hand; 3/5 B UE)      Exercises   Shoulder Instructions   General Comments      Pertinent Vitals/ Pain       Pain Assessment: Faces Pain Score: 5  (Pt. daughter reports pt. typically answers 5/10 for  everything.) Faces Pain Scale: Hurts whole lot Pain Location: Left Hip Pain Descriptors / Indicators: Operative site guarding;Discomfort;Crying Pain Intervention(s): Limited activity within patient's tolerance;Repositioned;Ice applied  Home Living Family/patient expects to be discharged to:: Skilled nursing facility                                        Prior Functioning/Environment Level of Independence: Needs assistance        Comments: per daughter, she receives help from staff for all ADLS. She uses RW for mobility, however at times forgets to use it.    Frequency  Min 1X/week        Progress Toward Goals  OT Goals(current goals can now be found in the care plan section)     Acute Rehab OT Goals Patient Stated Goal: To get stronger ADL Goals Pt Will Perform Eating: with set-up;with min assist;with adaptive utensils (using foam utensil holder wtih R hand) Pt Will Perform Grooming: with set-up;with min assist;bed level (using foam utensil holder in R hand)  Plan      Co-evaluation  End of Session     Activity Tolerance Patient tolerated treatment well   Patient Left in chair;with call bell/phone within reach;with chair alarm set   Nurse Communication          Time: [id number] OT Time Calculation (min): 23 min  Charges: OT General Charges $OT Visit: 1 Procedure OT Evaluation $OT Eval Moderate Complexity: 1 Procedure  Olegario MessierElaine Vercie Pokorny, MS, OTR/L 10/22/2016, 11:52 AM

## 2016-10-22 NOTE — Progress Notes (Signed)
Per MD patient will likely be ready for D/C tomorrow. Plan is for [facility] to [facility]. [facility] is aware of above.   [employer] Hughes IncorporatedBailey Besse Miron, LCSW [phone]

## 2016-10-22 NOTE — Progress Notes (Signed)
   Subjective: 1 Day Post-Op Procedure(s) (LRB): INTRAMEDULLARY (IM) NAIL INTERTROCHANTRIC (Left) Patient reports pain as mild.   Patient is well, and has had no acute complaints or problems We will start therapy today.  Plan is to go Rehab after hospital stay. no nausea and no vomiting Patient denies any chest pains or shortness of breath.  Objective: Vital signs in last 24 hours: Temp:  [96.5 F (35.8 C)-100.2 F (37.9 C)] 97.8 F (36.6 C) (01/08 2217) Pulse Rate:  [44-89] 69 (01/09 0434) Resp:  [17-31] 28 (01/08 2030) BP: (103-152)/(46-77) 103/46 (01/09 0434) SpO2:  [93 %-100 %] 93 % (01/09 0434) well approximated incision Heels are non tender and elevated off the bed using rolled towels Intake/Output from previous day: 01/08 0701 - 01/09 0700 In: 3021.7 [P.O.:280; I.V.:2441.7; IV Piggyback:300] Out: 875 [Urine:850; Blood:25] Intake/Output this shift: No intake/output data recorded.   Recent Labs  10/20/16 0816 10/22/16 0445  HGB 12.8 10.2*    Recent Labs  10/20/16 0816 10/22/16 0445  WBC 15.9* 13.9*  RBC 3.98 3.33*  HCT 36.8 31.2*  PLT 344 308    Recent Labs  10/20/16 0816 10/22/16 0445  NA 141 145  K 4.3 3.5  CL 113* 121*  CO2 20* 18*  BUN 24* 26*  CREATININE 1.28* 0.98  GLUCOSE 107* 112*  CALCIUM 8.4* 7.8*    Recent Labs  10/21/16 0401 10/21/16 1409  INR 1.38 1.36    EXAM General - Patient is Alert, Appropriate and Oriented Extremity - Neurologically intact Neurovascular intact Sensation intact distally Intact pulses distally Dorsiflexion/Plantar flexion intact No cellulitis present Compartment soft Dressing - scant drainage Motor Function - intact, moving foot and toes well on exam.    Past Medical History:  Diagnosis Date  . Arthritis   . Hematuria   . High cholesterol   . Hypertension   . Post herpetic neuralgia    Right side of neck  . Renal disorder   . Shingles   . TIA (transient ischemic attack)   . Vascular  dementia     Assessment/Plan: 1 Day Post-Op Procedure(s) (LRB): INTRAMEDULLARY (IM) NAIL INTERTROCHANTRIC (Left) Principal Problem:   Hip fracture (HCC)  Estimated body mass index is 19.57 kg/m as calculated from the following:   Height as of this encounter: 5\' 3"  (1.6 m).   Weight as of this encounter: 50.1 kg (110 lb 8 oz). Advance diet Up with therapy D/C IV fluids Plan for discharge tomorrow Discharge to SNF  Labs: Were reviewed DVT Prophylaxis - Foot Pumps, TED hose and Eliquis Weight-Bearing as tolerated to left leg D/C O2 and Pulse OX and try on Room Air Begin working on having a bowel movement [facility] weeks. Sooner if any problems Change dressing as needed Continue Eliquis as before surgery Staples will need to be removed 2 weeks postop follow-up by the application of benzoin and Steri-Strips  [facility] PA [facility] 10/22/2016, 7:13 AM

## 2016-10-22 NOTE — Progress Notes (Signed)
Pt came back on the floor per bed from PACU, alert and oriented to person, place and situation. Post ORIF of left femoral fracture with honeycomb dressing on operative site clean, dry and intact. Both lower extremities responds to commands, weak against resistance, bilateral pedal pulses moderate. Pt c/o pain on the surgical site, will administer PRN pain medicine. Fluids infusing well via peripheral IV on the right hand. Vital signs taken by NT, stable and afebrile. Daughters at the bedside. Will continue to monitor.

## 2016-10-23 LAB — CBC
HCT: 33.5 % — ABNORMAL LOW (ref 35.0–47.0)
Hemoglobin: 11 g/dL — ABNORMAL LOW (ref 12.0–16.0)
MCH: 31.3 pg (ref 26.0–34.0)
MCHC: 32.9 g/dL (ref 32.0–36.0)
MCV: 95 fL (ref 80.0–100.0)
PLATELETS: 356 10*3/uL (ref 150–440)
RBC: 3.53 MIL/uL — AB (ref 3.80–5.20)
RDW: 14.5 % (ref 11.5–14.5)
WBC: 12.9 10*3/uL — ABNORMAL HIGH (ref 3.6–11.0)

## 2016-10-23 LAB — BASIC METABOLIC PANEL
Anion gap: 11 (ref 5–15)
BUN: 27 mg/dL — AB (ref 6–20)
CALCIUM: 8.1 mg/dL — AB (ref 8.9–10.3)
CO2: 14 mmol/L — ABNORMAL LOW (ref 22–32)
CREATININE: 1.19 mg/dL — AB (ref 0.44–1.00)
Chloride: 118 mmol/L — ABNORMAL HIGH (ref 101–111)
GFR calc Af Amer: 46 mL/min — ABNORMAL LOW (ref >60)
GFR, EST NON AFRICAN AMERICAN: 40 mL/min — AB (ref >60)
GLUCOSE: 107 mg/dL — AB (ref 65–99)
POTASSIUM: 3.5 mmol/L (ref 3.5–5.1)
SODIUM: 143 mmol/L (ref 135–145)

## 2016-10-23 MED ORDER — FERROUS SULFATE 325 (65 FE) MG PO TABS: 325.0000 mg | ORAL_TABLET | Freq: Two times a day (BID) | ORAL | 3 refills | Status: AC

## 2016-10-23 MED ORDER — ENSURE ENLIVE PO LIQD: 1.0000 | Freq: Three times a day (TID) | ORAL | 12 refills | Status: AC

## 2016-10-23 MED ORDER — CEPHALEXIN 500 MG PO CAPS: 500.0000 mg | ORAL_CAPSULE | Freq: Two times a day (BID) | ORAL | 0 refills | Status: AC

## 2016-10-23 MED ORDER — TRAMADOL HCL 50 MG PO TABS: 50.0000 mg | ORAL_TABLET | Freq: Three times a day (TID) | ORAL | 0 refills | Status: AC

## 2016-10-23 NOTE — Progress Notes (Signed)
Patient is medically stable for D/C to [facility] today. Per Sue LushAndrea admissions coordinator [facility] can return to her room 227. RN will call report at [phone] and arrange EMS for transport. Clinical Child psychotherapistocial Worker (CSW) [facility] [employer] today. Patient's daughter/ HPOA Lurena JoinerRebecca is at bedside and aware of D/C today. Please reconsult if future social work needs arise. CSW signing off.   [employer]Bailey Jedidiah Demartini, LCSW [phone]

## 2016-10-23 NOTE — Progress Notes (Signed)
[facility] to [facility] and EMS was notified of transport. Daughter in room, and was notified.

## 2016-10-23 NOTE — Discharge Summary (Signed)
[facility] - [place] at [facility]   PATIENT NAME: Kristina Aguirre    MR#:  [id number]  DATE OF BIRTH:  01/17/1927  DATE OF ADMISSION:  10/17/2016 ADMITTING PHYSICIAN: Ihor AustinPavan Pyreddy, MD  DATE OF DISCHARGE: 10/23/16  PRIMARY CARE PHYSICIAN: Tillman Abideichard Letvak, MD    ADMISSION DIAGNOSIS:  Fall [W19.XXXA] Acute hip pain, left [M25.552] Closed 2-part intertrochanteric fracture of left femur, initial encounter (HCC) [S72.142A]  DISCHARGE DIAGNOSIS:  Left hip fracture s/p surgery POD #2 UTI Failure to thrive/poor po intake  SECONDARY DIAGNOSIS:   Past Medical History:  Diagnosis Date  . Arthritis   . Hematuria   . High cholesterol   . Hypertension   . Post herpetic neuralgia    Right side of neck  . Renal disorder   . Shingles   . TIA (transient ischemic attack)   . Vascular dementia     HOSPITAL COURSE:   NancyRichbourgis a 83 y.o.femalewith a known history of Arthritis, hypertension, hyperlipidemia, transient ischemic attack, vascular dementia, atrial fibrillation from nursing home facility onoral eliquisfor anticoagulation had sustained a fall.Patient lost balance and fell and landed on her left hip.Patient has tenderness in the left hip area  1.Intertrochanteric fracture left hip due toAccidental fall -s/p surgery POD #2 -pt due to her chronic pain and arthritis not motivated to do PT -prn pain meds  2.Hypertension -amlodipine  3.Hyperlipidemia -cont statins  4.fever due to GNR UTI -IV rocephin---change to po keflex -UC GNR per lab cxR LL atelectasis--incentive spirometer  5. DvT prophylaxis resumed eliquis  6. Failure to thrive with poor po intake Spoke with dter Kristina Joinerebecca and she is aware about overall pt's prognosis. Pt remains DNR and will cont to encourage to eat. Dtr understands the outcome and aware about Palliative care and hospice.Long term poor prognosis  Will d/c to [facility] today D/w ortho Jon  CONSULTS  OBTAINED:  Treatment Team:  Donato HeinzJames P Hooten, MD  DRUG ALLERGIES:   Allergies  Allergen Reactions  . Penicillins Hives    DISCHARGE MEDICATIONS:   Current Discharge Medication List    START taking these medications   Details  cephALEXin (KEFLEX) 500 MG capsule Take 1 capsule (500 mg total) by mouth every 12 (twelve) hours. Qty: 6 capsule, Refills: 0    feeding supplement, ENSURE ENLIVE, (ENSURE ENLIVE) LIQD Take 237 mLs by mouth 3 (three) times daily between meals. Qty: 237 mL, Refills: 12    ferrous sulfate 325 (65 FE) MG tablet Take 1 tablet (325 mg total) by mouth 2 (two) times daily with a meal. Qty: 60 tablet, Refills: 3      CONTINUE these medications which have CHANGED   Details  traMADol (ULTRAM) 50 MG tablet Take 1 tablet (50 mg total) by mouth 3 (three) times daily. Take 1 tablet (50 mg) every morning and at bedtime, take 2 tablets (100 mg) daily at 3pm Qty: 30 tablet, Refills: 0      CONTINUE these medications which have NOT CHANGED   Details  ALPRAZolam (XANAX) 0.5 MG tablet Take 0.125 mg by mouth 3 (three) times daily as needed for anxiety.    amLODipine (NORVASC) 2.5 MG tablet Take 2.5 mg by mouth daily.    apixaban (ELIQUIS) 2.5 MG TABS tablet Take 1 tablet (2.5 mg total) by mouth 2 (two) times daily. Qty: 60 tablet, Refills: 0    cetirizine (ZYRTEC) 10 MG tablet Take 10 mg by mouth daily.    gabapentin (NEURONTIN) 400 MG capsule Take 400 mg by  mouth 3 (three) times daily.     Lidocaine (ASPERCREME LIDOCAINE) 4 % PTCH Apply 1 patch topically every morning.    oxyCODONE-acetaminophen (PERCOCET/ROXICET) 5-325 MG tablet Take 1 tablet by mouth every 6 (six) hours as needed for severe pain. Qty: 15 tablet, Refills: 0    simvastatin (ZOCOR) 20 MG tablet Take 20 mg by mouth at bedtime.     OVER THE COUNTER MEDICATION Place 1 drop into both eyes at bedtime. Over the counter eye drops for dry eyes        If you experience worsening of your admission  symptoms, develop shortness of breath, life threatening emergency, suicidal or homicidal thoughts you must seek medical attention immediately by calling 911 or calling your MD immediately  if symptoms less severe.  You Must read complete instructions/literature along with all the possible adverse reactions/side effects for all the Medicines you take and that have been prescribed to you. Take any new Medicines after you have completely understood and accept all the possible adverse reactions/side effects.   Please note  You were cared for by a hospitalist during your hospital stay. If you have any questions about your discharge medications or the care you received while you were in the hospital after you are discharged, you can call the unit and asked to speak with the hospitalist on call if the hospitalist that took care of you is not available. Once you are discharged, your primary care physician will handle any further medical issues. Please note that NO REFILLS for any discharge medications will be authorized once you are discharged, as it is imperative that you return to your primary care physician (or establish a relationship with a primary care physician if you do not have one) for your aftercare needs so that they can reassess your need for medications and monitor your lab values. Today   SUBJECTIVE   Not motivated to eat.  Keeps asking for pain meds  VITAL SIGNS:  Blood pressure (!) 164/75, pulse 79, temperature 97.7 F (36.5 C), temperature source Oral, resp. rate 18, height 5\' 3"  (1.6 m), weight 50.1 kg (110 lb 8 oz), SpO2 96 %.  I/O:   Intake/Output Summary (Last 24 hours) at 10/23/16 0751 Last data filed at 10/23/16 0608  Gross per 24 hour  Intake          2425.84 ml  Output                0 ml  Net          2425.84 ml    PHYSICAL EXAMINATION:  GENERAL:  83 y.o.-year-old patient lying in the bed with no acute distress.  EYES: Pupils equal, round, reactive to light and  accommodation. No scleral icterus. Extraocular muscles intact.  HEENT: Head atraumatic, normocephalic. Oropharynx and nasopharynx clear. Neck contrcture NECK:  Supple, no jugular venous distention. No thyroid enlargement, no tenderness.  LUNGS: Normal breath sounds bilaterally, no wheezing, rales,rhonchi or crepitation. No use of accessory muscles of respiration.  CARDIOVASCULAR: S1, S2 normal. No murmurs, rubs, or gallops.  ABDOMEN: Soft, non-tender, non-distended. Bowel sounds present. No organomegaly or mass.  EXTREMITIES: No pedal edema, cyanosis, or clubbing. Severe arthritic changes NEUROLOGIC: unable to assess due to pt participation but grossly non focal PSYCHIATRIC: The patient is alert and oriented SKIN: No obvious rash, lesion, or ulcer.   DATA REVIEW:   CBC   Recent Labs Lab 10/23/16 0428  WBC 12.9*  HGB 11.0*  HCT 33.5*  PLT 356  Chemistries   Recent Labs Lab 10/20/16 0816  10/23/16 0428  NA 141  < > 143  K 4.3  < > 3.5  CL 113*  < > 118*  CO2 20*  < > 14*  GLUCOSE 107*  < > 107*  BUN 24*  < > 27*  CREATININE 1.28*  < > 1.19*  CALCIUM 8.4*  < > 8.1*  AST 24  --   --   ALT 18  --   --   ALKPHOS 105  --   --   BILITOT 1.0  --   --   < > = values in this interval not displayed.  Microbiology Results   Recent Results (from the past 240 hour(s))  MRSA PCR Screening     Status: None   Collection Time: 10/18/16  4:09 AM  Result Value Ref Range Status   MRSA by PCR NEGATIVE NEGATIVE Final    Comment:        The GeneXpert MRSA Assay (FDA approved for NASAL specimens only), is one component of a comprehensive MRSA colonization surveillance program. It is not intended to diagnose MRSA infection nor to guide or monitor treatment for MRSA infections.   Culture, Urine     Status: Abnormal   Collection Time: 10/20/16  8:28 AM  Result Value Ref Range Status   Specimen Description URINE, RANDOM  Final   Special Requests NONE  Final   Culture MULTIPLE  SPECIES PRESENT, SUGGEST RECOLLECTION (A)  Final   Report Status 10/21/2016 FINAL  Final    RADIOLOGY:  Dg Hip Operative Unilat W Or W/o Pelvis Left  Result Date: 10/21/2016 CLINICAL DATA:  Left hip fixation. EXAM: OPERATIVE LEFT HIP (WITH PELVIS IF PERFORMED) 1 VIEWS TECHNIQUE: Fluoroscopic spot image(s) were submitted for interpretation post-operatively. COMPARISON:  10/17/2016 FINDINGS: placement of screw fixation device across the previously described proximal left femur fracture. Alignment is anatomic in 1 plane, without acute hardware complication identified. Distal portion of the fixation device is excluded. IMPRESSION: Intraoperative imaging of proximal left femoral fixation. Electronically Signed   By: Jeronimo Greaves M.D.   On: 10/21/2016 19:34   Dg Femur Port Min 2 Views Left  Result Date: 10/21/2016 CLINICAL DATA:  Follow-up femur fracture EXAM: LEFT FEMUR PORTABLE 2 VIEWS COMPARISON:  10/17/2016 FINDINGS: Patient is status post intramedullary rod fixation of the left femur intertrochanteric fracture with anatomic alignment. Single fixating screw distally. No femoral head dislocation. Cutaneous staples lateral hip. Tiny pockets of soft tissue gas are felt postoperative. IMPRESSION: Interval surgical fixation of proximal left femur fracture with anatomic alignment and expected postsurgical changes. Electronically Signed   By: Jasmine Pang M.D.   On: 10/21/2016 20:58     Management plans discussed with the patient, family and they are in agreement.  CODE STATUS:     Code Status Orders        Start     Ordered   10/18/16 0359  Do not attempt resuscitation (DNR)  Continuous    Question Answer Comment  In the event of cardiac or respiratory ARREST Do not call a "code blue"   In the event of cardiac or respiratory ARREST Do not perform Intubation, CPR, defibrillation or ACLS   In the event of cardiac or respiratory ARREST Use medication by any route, position, wound care, and other  measures to relive pain and suffering. May use oxygen, suction and manual treatment of airway obstruction as needed for comfort.      10/18/16 1610  Code Status History    Date Active Date Inactive Code Status Order ID Comments User Context   08/21/2014  4:33 PM 08/24/2014  2:32 PM Full Code [id number]  Courtney Paris, MD Inpatient    Advance Directive Documentation   Flowsheet Row Most Recent Value  Type of Advance Directive  Healthcare Power of Attorney, Living will  Pre-existing out of facility DNR order (yellow form or pink MOST form)  Yellow form placed in chart (order not valid for inpatient use)  "MOST" Form in Place?  No data      TOTAL TIME TAKING CARE OF THIS PATIENT: 40 minutes.    Rashawnda Gaba M.D on 10/23/2016 at 7:51 AM  Between 7am to 6pm - Pager - [phone] After 6pm go to [email or web address] - password [email or web address] [facility]  [facility]  Office  [phone]  CC: Primary care physician; Tillman Abide, MD

## 2016-10-23 NOTE — Progress Notes (Signed)
Physical Therapy Treatment Patient Details Name: Kristina Aguirre MRN: [id number] DOB: [birth year] Today's Date: 10/23/2016    History of Present Illness Pt. is an 83 y.o. female who was admitted to [facility] for ORIF repiar of a Left Femur Fracture. Pt. PMHx includes: arthritis, HTN, TIA, and Vascular Dementia.     PT Comments    Pt is making gradual progress towards goals however limited ability to participate in care. Pt needs heavy encouragement from PT and family in room to participate. Pt interested in drinking sips of Coke. Family gave Coke during rest breaks. Able to sit at EOB with +2 assist, only able to tolerate for brief period of time. Returned back supine in bed.  Follow Up Recommendations  SNF     Equipment Recommendations       Recommendations for Other Services       Precautions / Restrictions Precautions Precautions: Fall Restrictions Weight Bearing Restrictions: Yes LLE Weight Bearing: Weight bearing as tolerated    Mobility  Bed Mobility Overal bed mobility: Needs Assistance Bed Mobility: Supine to Sit     Supine to sit: Total assist;+2 for physical assistance     General bed mobility comments: assist to EOB with +2 assist. Pt only able to maintain seated position for approx 10-15 seconds prior to fatigue. Assist for returning back to bed and proper positioning.  Transfers                 General transfer comment: not appropriate for transfers at this time  Ambulation/Gait                 Stairs            Wheelchair Mobility    Modified Rankin (Stroke Patients Only)       Balance                                    Cognition Arousal/Alertness: Awake/alert Behavior During Therapy: WFL for tasks assessed/performed Overall Cognitive Status: History of cognitive impairments - at baseline                      Exercises Other Exercises Other Exercises: Supine ther-ex on B LE including ankle pumps,  quad sets, SLR, hip abd/add, and SAQ. ALl ther-ex performed x 12 reps with mod/max assist and cues for sequencing.    General Comments        Pertinent Vitals/Pain Pain Assessment: Faces Faces Pain Scale: Hurts worst Pain Location: Left Hip Pain Descriptors / Indicators: Operative site guarding;Discomfort;Crying Pain Intervention(s): Premedicated before session;Limited activity within patient's tolerance;Ice applied    Home Living                      Prior Function            PT Goals (current goals can now be found in the care plan section) Acute Rehab PT Goals Patient Stated Goal: To get stronger PT Goal Formulation: With patient Time For Goal Achievement: 11/05/16 Potential to Achieve Goals: Good Progress towards PT goals: Progressing toward goals    Frequency    BID      PT Plan Current plan remains appropriate    Co-evaluation             End of Session   Activity Tolerance: Patient limited by pain Patient left: in bed;with bed alarm set;with family/visitor present;with  SCD's reapplied     Time: 1010-1033 PT Time Calculation (min) (ACUTE ONLY): 23 min  Charges:  $Therapeutic Exercise: 8-22 mins $Therapeutic Activity: 8-22 mins                    G Codes:      Fredna Stricker 10/23/2016, 12:52 PM Elizabeth PalauStephanie Sadaf Przybysz, PT, DPT [phone]

## 2016-10-24 DIAGNOSIS — S72012A Unspecified intracapsular fracture of left femur, initial encounter for closed fracture: Secondary | ICD-10-CM

## 2016-10-24 DIAGNOSIS — I482 Chronic atrial fibrillation: Secondary | ICD-10-CM | POA: Diagnosis not present

## 2016-10-24 DIAGNOSIS — N390 Urinary tract infection, site not specified: Secondary | ICD-10-CM | POA: Diagnosis not present

## 2016-10-24 DIAGNOSIS — F0150 Vascular dementia without behavioral disturbance: Secondary | ICD-10-CM

## 2016-10-25 DIAGNOSIS — I4891 Unspecified atrial fibrillation: Secondary | ICD-10-CM

## 2016-10-25 DIAGNOSIS — M6248 Contracture of muscle, other site: Secondary | ICD-10-CM

## 2016-10-25 DIAGNOSIS — I679 Cerebrovascular disease, unspecified: Secondary | ICD-10-CM | POA: Diagnosis not present

## 2016-10-25 DIAGNOSIS — F0151 Vascular dementia with behavioral disturbance: Secondary | ICD-10-CM

## 2016-12-02 ENCOUNTER — Ambulatory Visit: Payer: BLUE CROSS/BLUE SHIELD | Admitting: Podiatry

## 2016-12-06 DIAGNOSIS — I4891 Unspecified atrial fibrillation: Secondary | ICD-10-CM | POA: Diagnosis not present

## 2016-12-06 DIAGNOSIS — I69398 Other sequelae of cerebral infarction: Secondary | ICD-10-CM | POA: Diagnosis not present

## 2016-12-06 DIAGNOSIS — M792 Neuralgia and neuritis, unspecified: Secondary | ICD-10-CM | POA: Diagnosis not present

## 2016-12-06 DIAGNOSIS — I10 Essential (primary) hypertension: Secondary | ICD-10-CM | POA: Diagnosis not present

## 2016-12-06 DIAGNOSIS — F0150 Vascular dementia without behavioral disturbance: Secondary | ICD-10-CM | POA: Diagnosis not present

## 2016-12-09 ENCOUNTER — Ambulatory Visit: Payer: Medicare Other | Admitting: Podiatry

## 2017-01-23 DIAGNOSIS — I4891 Unspecified atrial fibrillation: Secondary | ICD-10-CM

## 2017-01-23 DIAGNOSIS — M6248 Contracture of muscle, other site: Secondary | ICD-10-CM

## 2017-01-23 DIAGNOSIS — F0150 Vascular dementia without behavioral disturbance: Secondary | ICD-10-CM | POA: Diagnosis not present

## 2017-01-23 DIAGNOSIS — I679 Cerebrovascular disease, unspecified: Secondary | ICD-10-CM

## 2017-02-04 DIAGNOSIS — E441 Mild protein-calorie malnutrition: Secondary | ICD-10-CM

## 2017-02-04 DIAGNOSIS — I480 Paroxysmal atrial fibrillation: Secondary | ICD-10-CM | POA: Diagnosis not present

## 2017-02-04 DIAGNOSIS — I6930 Unspecified sequelae of cerebral infarction: Secondary | ICD-10-CM

## 2017-02-04 DIAGNOSIS — I10 Essential (primary) hypertension: Secondary | ICD-10-CM

## 2017-02-04 DIAGNOSIS — F0150 Vascular dementia without behavioral disturbance: Secondary | ICD-10-CM | POA: Diagnosis not present

## 2017-02-19 DIAGNOSIS — H9210 Otorrhea, unspecified ear: Secondary | ICD-10-CM | POA: Diagnosis not present

## 2017-03-27 DIAGNOSIS — S60222A Contusion of left hand, initial encounter: Secondary | ICD-10-CM | POA: Diagnosis not present

## 2017-04-09 DIAGNOSIS — E43 Unspecified severe protein-calorie malnutrition: Secondary | ICD-10-CM | POA: Diagnosis not present

## 2017-04-09 DIAGNOSIS — B0222 Postherpetic trigeminal neuralgia: Secondary | ICD-10-CM | POA: Diagnosis not present

## 2017-04-09 DIAGNOSIS — I69398 Other sequelae of cerebral infarction: Secondary | ICD-10-CM | POA: Diagnosis not present

## 2017-04-09 DIAGNOSIS — F0150 Vascular dementia without behavioral disturbance: Secondary | ICD-10-CM | POA: Diagnosis not present

## 2017-04-09 DIAGNOSIS — I10 Essential (primary) hypertension: Secondary | ICD-10-CM

## 2017-04-09 DIAGNOSIS — I4891 Unspecified atrial fibrillation: Secondary | ICD-10-CM | POA: Diagnosis not present

## 2017-04-23 DIAGNOSIS — I679 Cerebrovascular disease, unspecified: Secondary | ICD-10-CM | POA: Diagnosis not present

## 2017-04-23 DIAGNOSIS — M62442 Contracture of muscle, left hand: Secondary | ICD-10-CM | POA: Diagnosis not present

## 2017-04-23 DIAGNOSIS — F0150 Vascular dementia without behavioral disturbance: Secondary | ICD-10-CM | POA: Diagnosis not present

## 2017-04-23 DIAGNOSIS — I4891 Unspecified atrial fibrillation: Secondary | ICD-10-CM | POA: Diagnosis not present

## 2017-05-08 ENCOUNTER — Other Ambulatory Visit: Payer: Self-pay | Admitting: Internal Medicine

## 2017-05-08 MED ORDER — MORPHINE SULFATE (CONCENTRATE) 20 MG/ML PO SOLN: ORAL | 0 refills | Status: AC

## 2017-05-14 DEATH — deceased

## 2018-02-22 IMAGING — CT CT HEAD W/O CM
6 of 8 series · 16 of 47 positions shown, 17 images · non-contrast
Comparison: None.

CLINICAL DATA: Unwitnessed fall.  Patient may have hit head.

EXAM:
CT HEAD WITHOUT CONTRAST
CT CERVICAL SPINE WITHOUT CONTRAST
TECHNIQUE: Multidetector CT imaging of the head and cervical spine was
performed following the standard protocol without intravenous
contrast. Multiplanar CT image reconstructions of the cervical spine
were also generated.

[Series 2: head wo · axial · 0.39mm/px · z∈[-233,-178]mm · 2 of 33 slices shown, 3 images]
[im 11/33  brain]
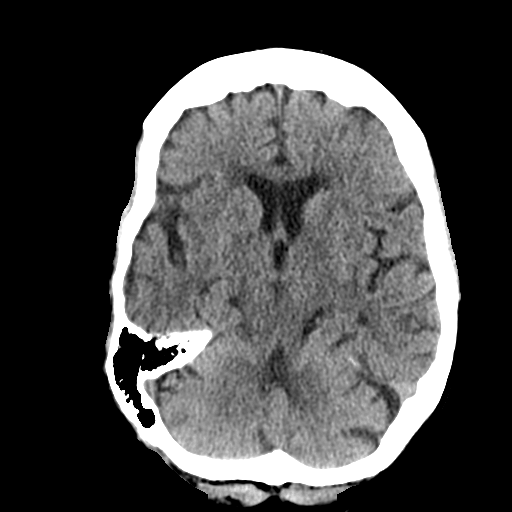
[im 11/33  bone]
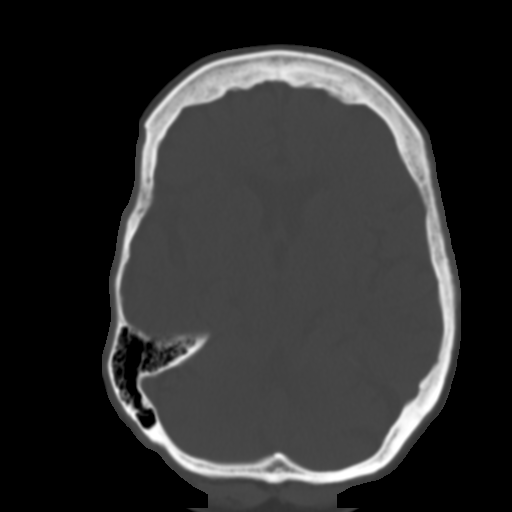
[im 22/33  brain]
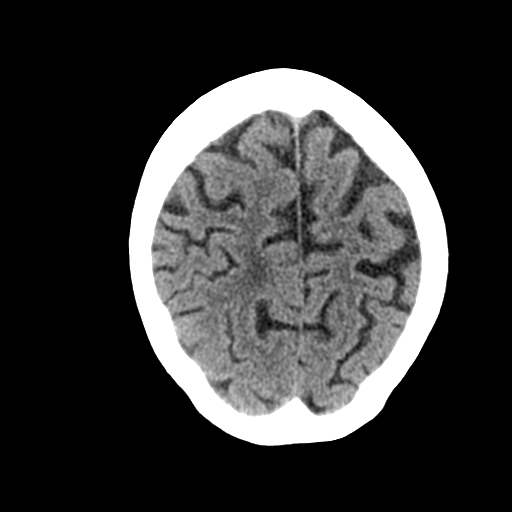

[Series 4: ax head wo recon · axial · 0.39mm/px · z∈[-227,-186]mm · 2 of 27 slices shown]
[im 9/27  brain]
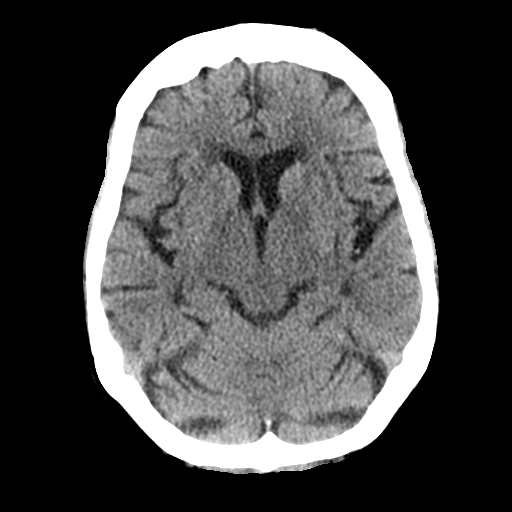
[im 18/27  brain]
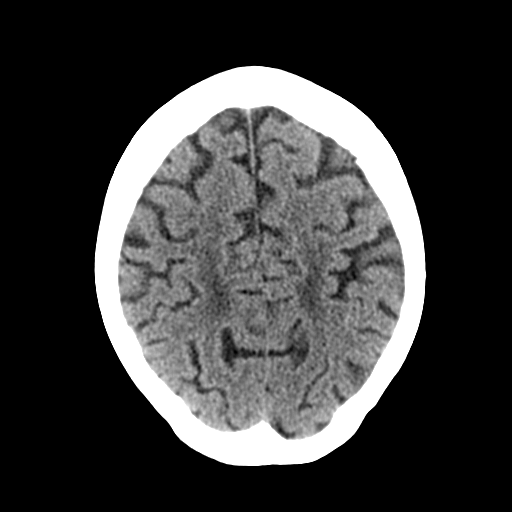

[Series 5: ax head bone recon · axial · 0.39mm/px · z∈[-258,-171]mm · 7 of 66 slices shown]
[im 9/66  bone]
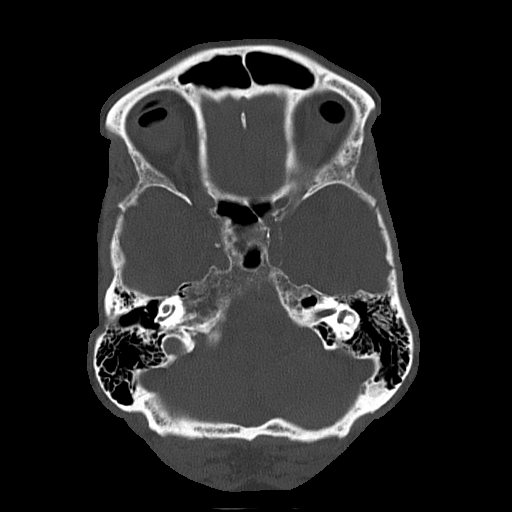
[im 17/66  bone]
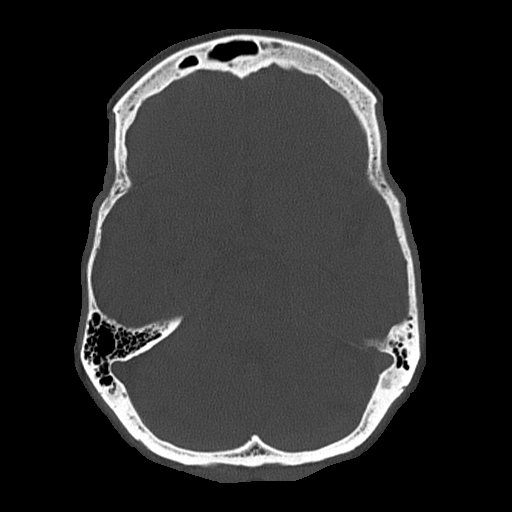
[im 25/66  bone]
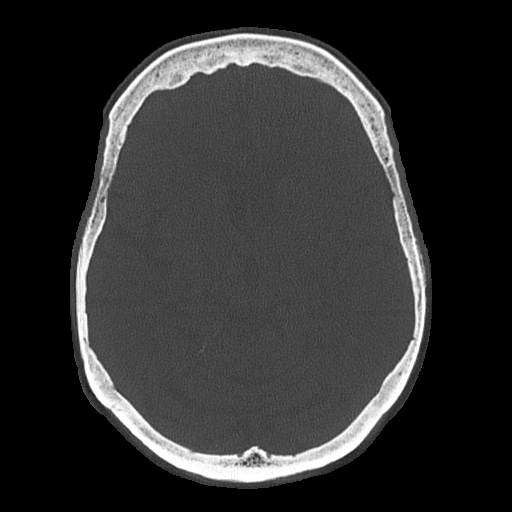
[im 33/66  bone]
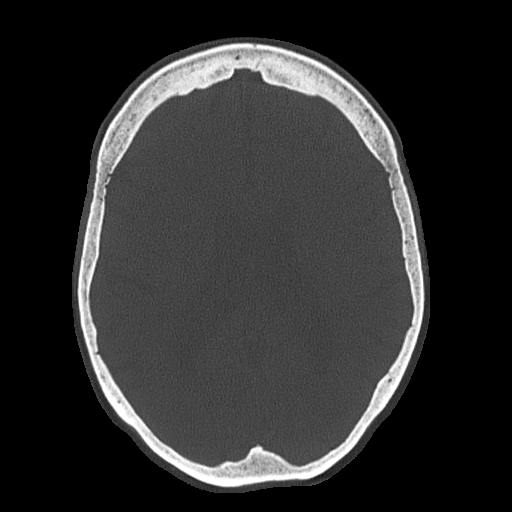
[im 41/66  bone]
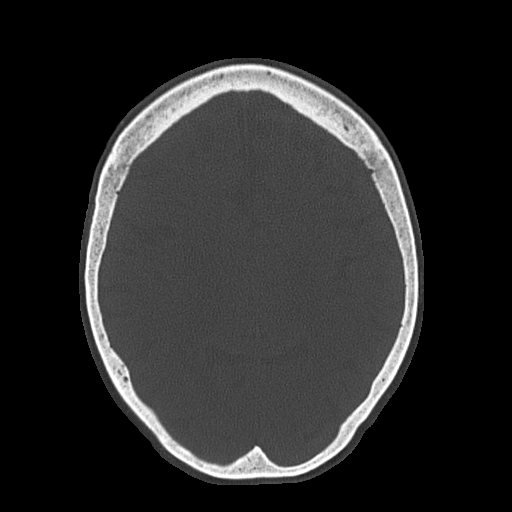
[im 49/66  bone]
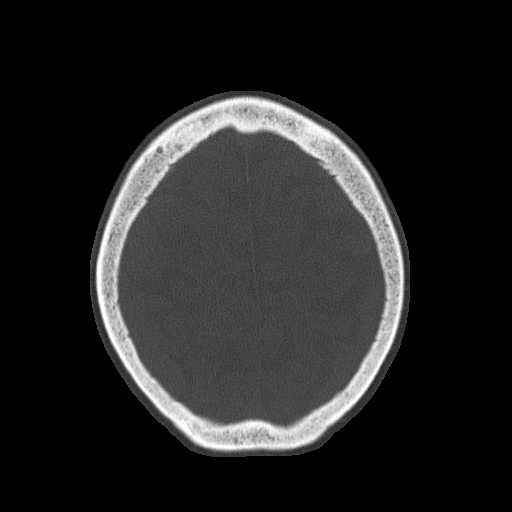
[im 57/66  bone]
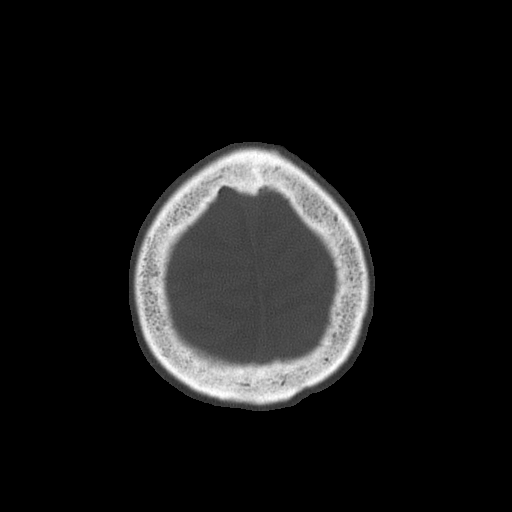

[Series 6: coronal soft tissue · coronal · 0.28mm/px · 3 of 62 slices shown]
[im 21/62  brain]
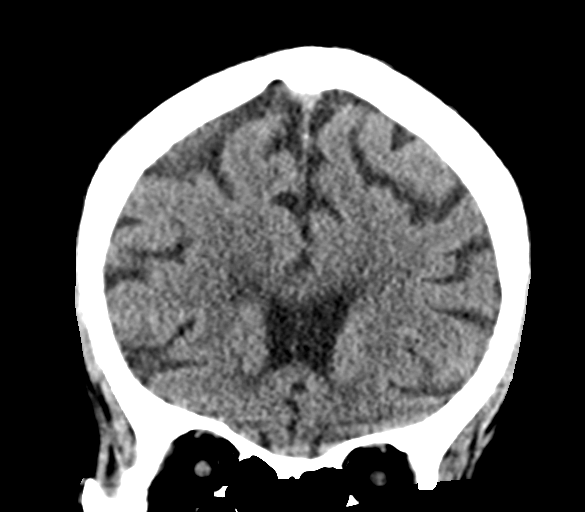
[im 28/62  brain]
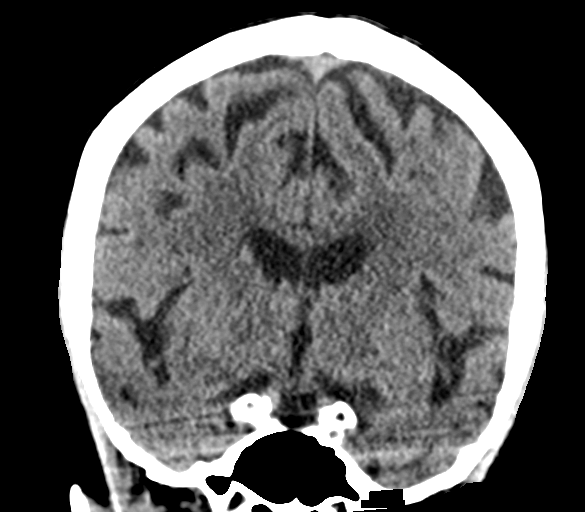
[im 34/62  brain]
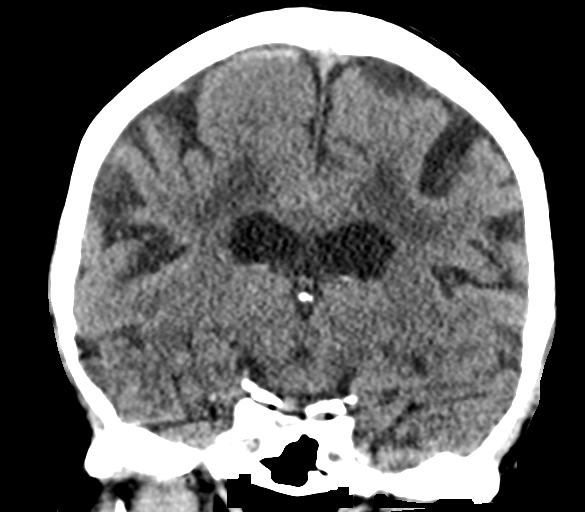

[Series 7: sagittal soft tissue · sagittal · 0.28mm/px · 1 of 47 slices shown]
[im 24/47  brain]
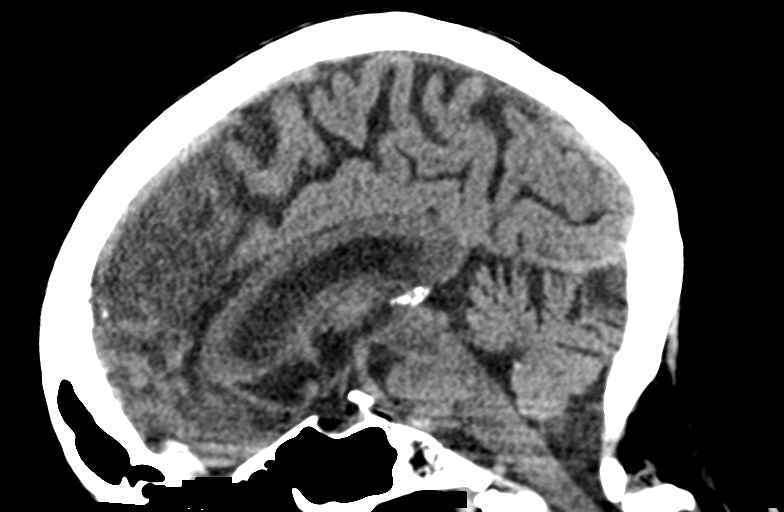

[Series 11: coronal bone · axial · 0.23mm/px · 1 of 41 slices shown]
[im 11/41  bone]
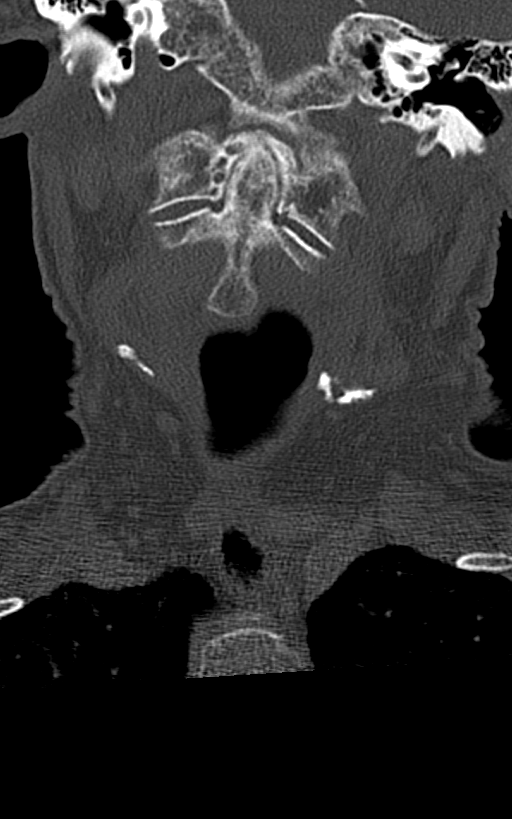

[16 of 47 positions shown; findings below may reference images not displayed]

FINDINGS: CT HEAD FINDINGS

Brain: Moderate cerebral atrophy with diffuse chronic small vessel
white matter changes are present bilaterally. No acute intracranial
hemorrhage, midline shift or edema. No intra-axial mass nor
extra-axial fluid collections.

Vascular: Carotid siphon calcifications are again noted. No
hyperdense vessels.

Skull: Nonacute

Sinuses/Orbits: Nonacute

Other: None

CT CERVICAL SPINE FINDINGS

Alignment: Reversal cervical lordosis again noted. 3 mm
anterolisthesis of C4 on C5 is also unchanged.

Skull base and vertebrae: No acute fracture bone destruction.

Soft tissues and spinal canal: No prevertebral soft tissue swelling
nor intraspinal hemorrhage.

Disc levels: Disc space narrowing at C4-5 and C5-6 as before.
Multilevel degenerative facet ankylosis from C2 through C7. No
significant neural foraminal encroachment.

Upper chest: Nonacute

Other: None
IMPRESSION: Cerebral atrophy with chronic moderate small vessel ischemic disease
of periventricular white matter. No acute intracranial abnormality.

Reversal cervical lordosis appears chronic with minimal
anterolisthesis of C4 on C5 as before. No acute osseous abnormality.
Cervical spondylosis. Bilateral facet ankylosis.

## 2018-02-22 IMAGING — CR DG HIP (WITH OR WITHOUT PELVIS) 2-3V*L*
1 series · 3 of 3 positions shown · non-contrast
Comparison: None.

CLINICAL DATA: Unwitnessed fall tonight.

EXAM:
DG HIP (WITH OR WITHOUT PELVIS) 2-3V LEFT

[Series 1: t pelvis ap · 0.14mm/px · 3 of 3 slices shown]
[im 1/3]
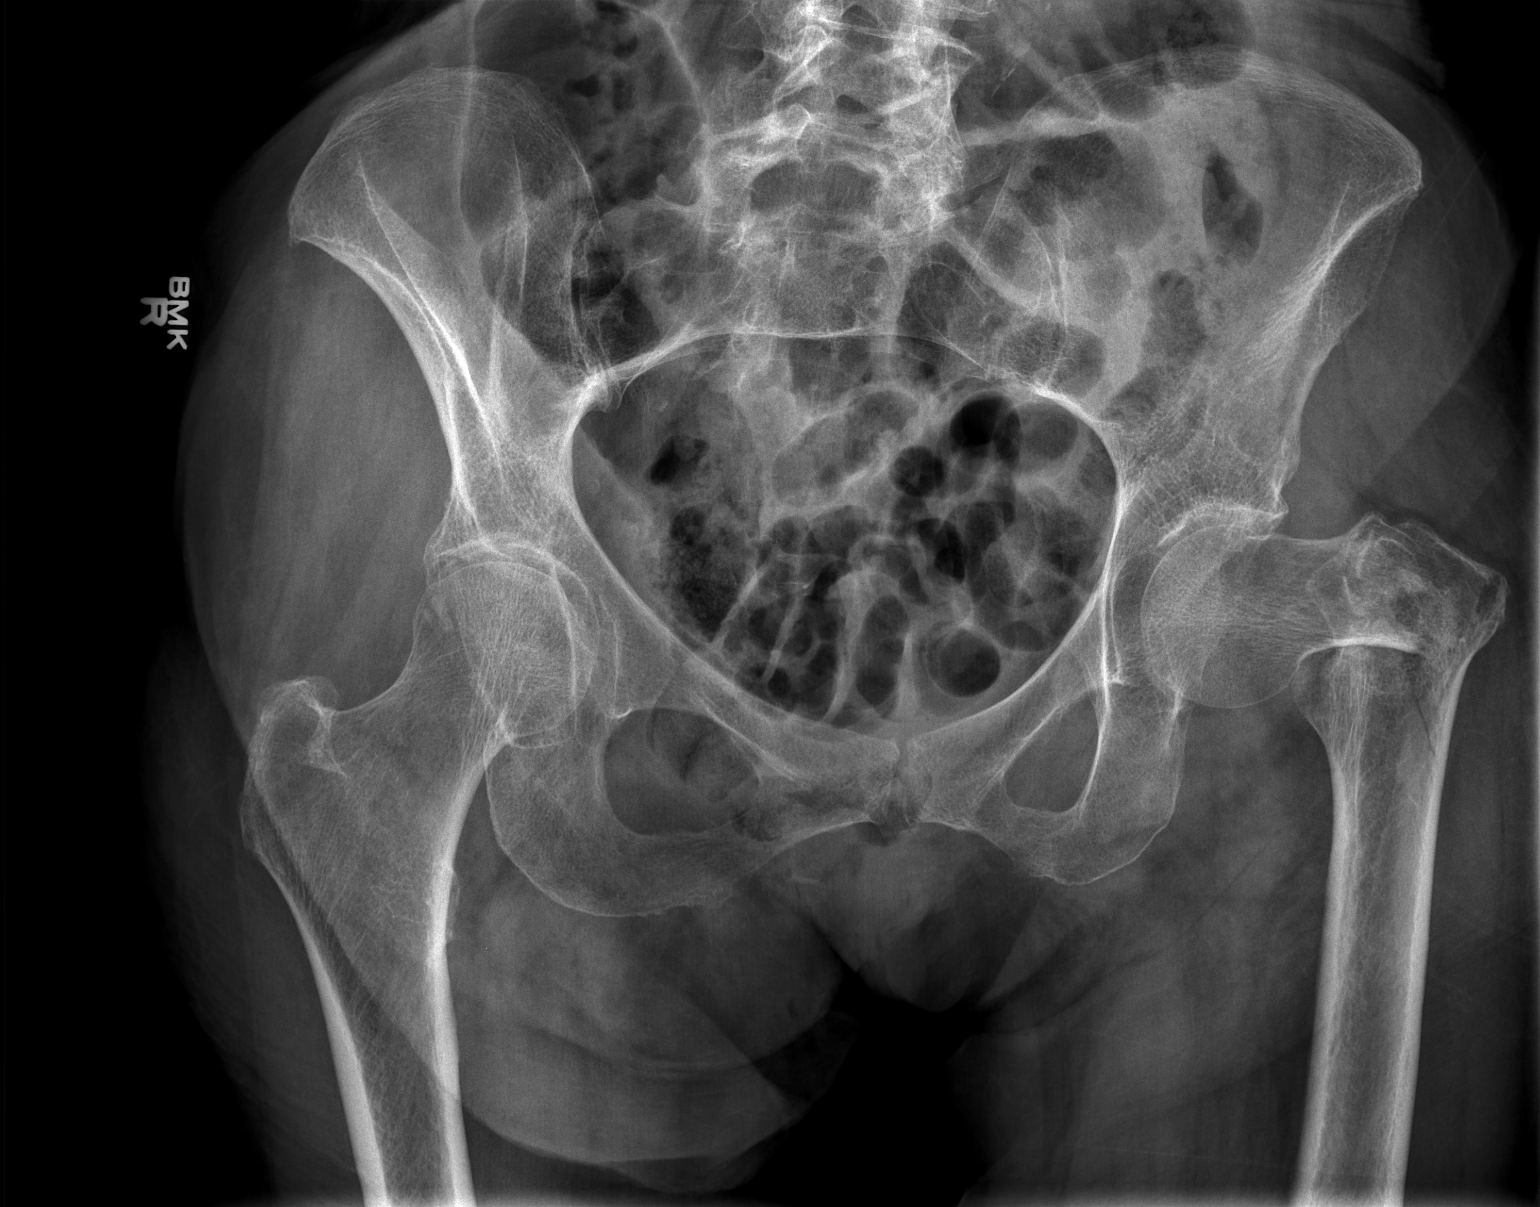
[im 2/3]
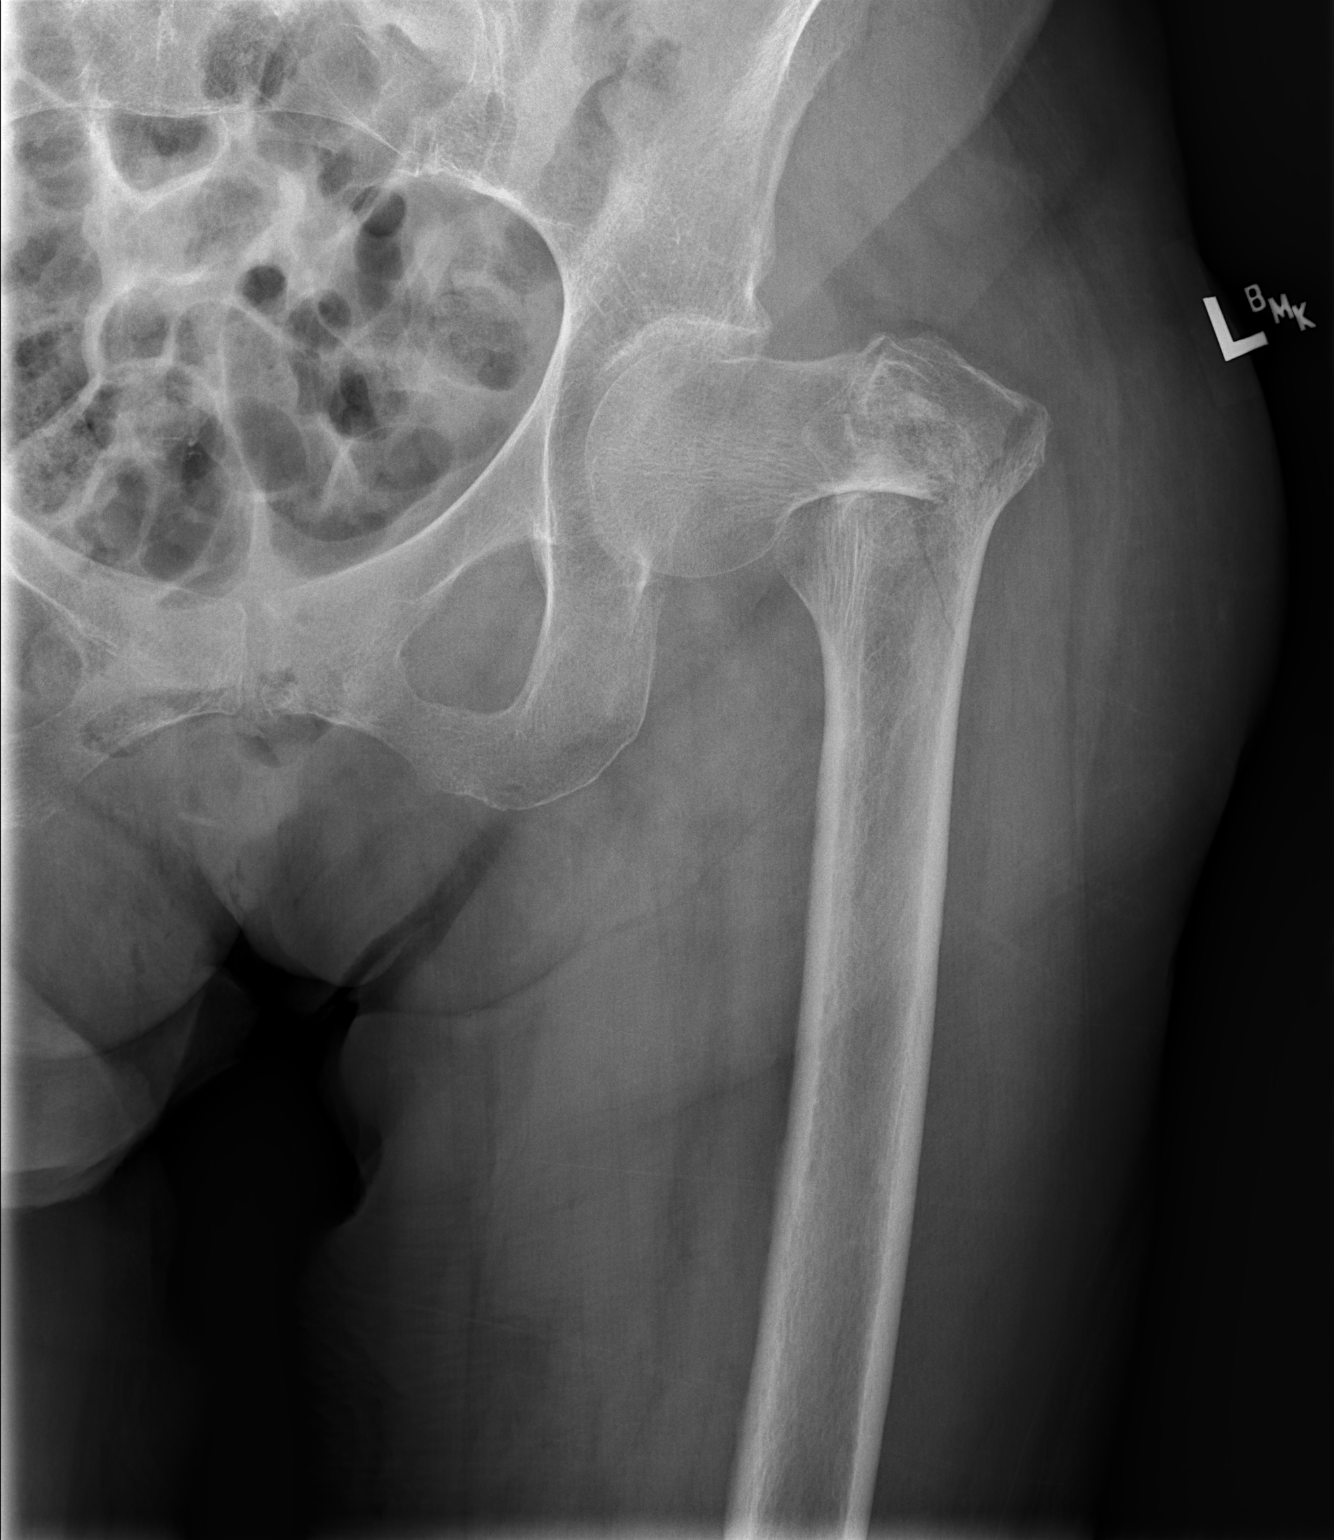
[im 3/3]
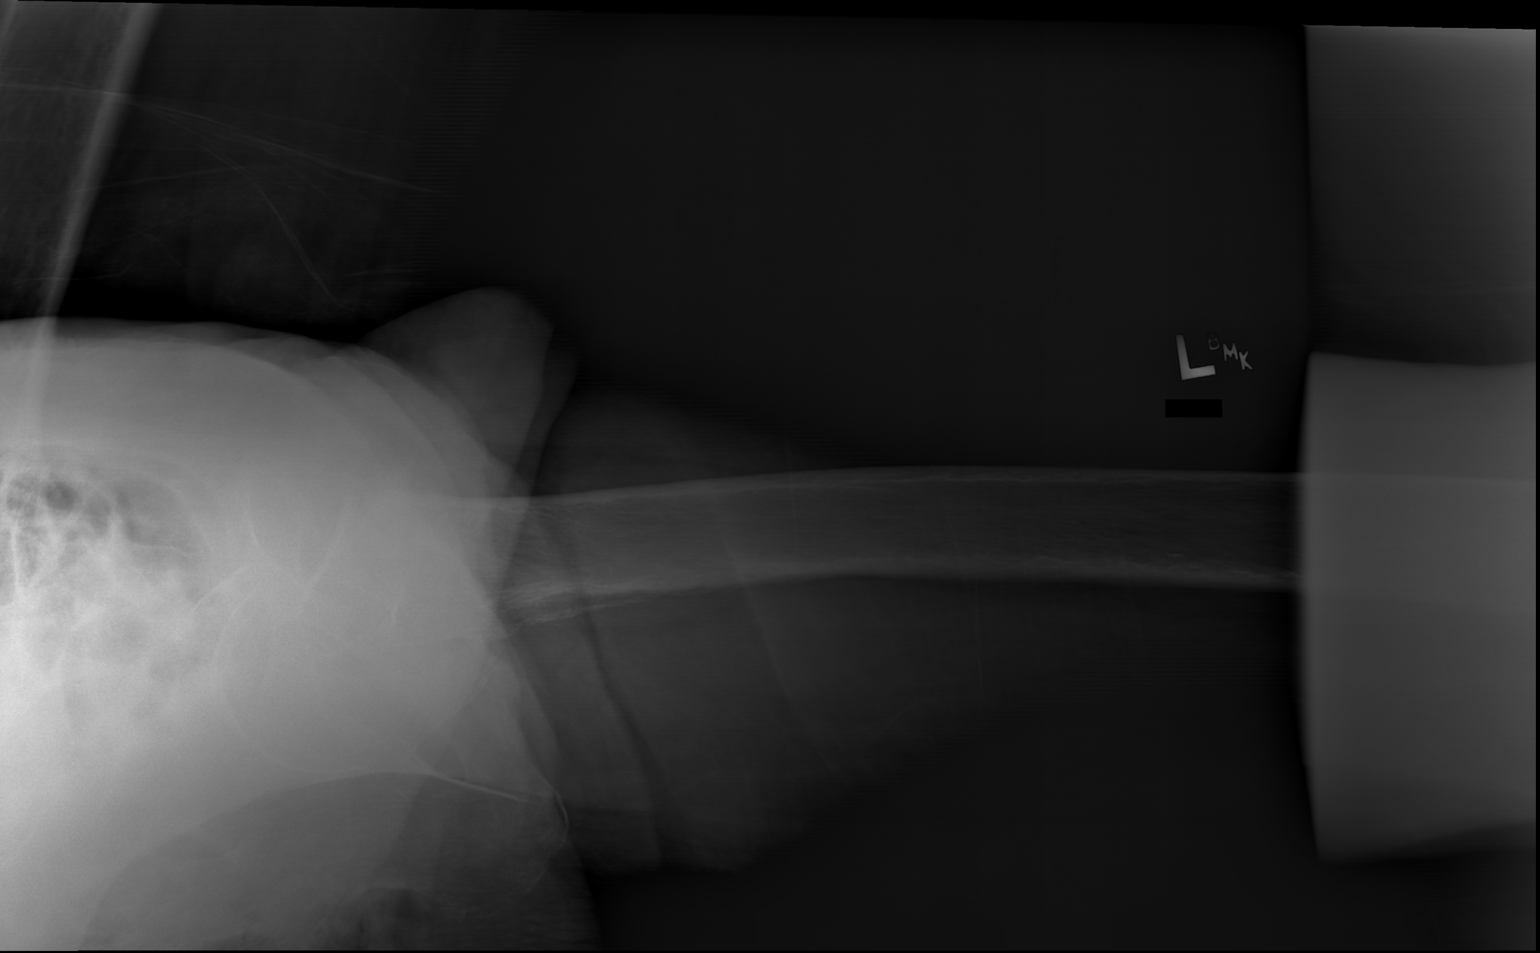

[3 of 3 positions shown; findings below may reference images not displayed]

FINDINGS: There is an intertrochanteric left hip fracture with longitudinal
component extending into the proximal diaphysis. There is varus
angulation. No dislocation. No radiographic findings to suggest a
pathologic basis for the fracture.

Bony pelvis is intact.
IMPRESSION: Intertrochanteric left hip fracture
